# Patient Record
Sex: Female | Born: 1990 | Hispanic: Yes | Marital: Single | Smoking: Never smoker
Health system: Southern US, Community
[De-identification: ages and names within clinical notes are randomized; demographics above are authoritative.]

## PROBLEM LIST (undated history)

## (undated) ENCOUNTER — Inpatient Hospital Stay (HOSPITAL_COMMUNITY): Payer: Self-pay

## (undated) ENCOUNTER — Emergency Department (HOSPITAL_COMMUNITY): Discharge status: Absent without leave | Payer: Self-pay

## (undated) DIAGNOSIS — O26619 Liver and biliary tract disorders in pregnancy, unspecified trimester: Secondary | ICD-10-CM

## (undated) DIAGNOSIS — E282 Polycystic ovarian syndrome: Secondary | ICD-10-CM

## (undated) DIAGNOSIS — D649 Anemia, unspecified: Secondary | ICD-10-CM

## (undated) DIAGNOSIS — N946 Dysmenorrhea, unspecified: Secondary | ICD-10-CM

## (undated) DIAGNOSIS — K831 Obstruction of bile duct: Secondary | ICD-10-CM

## (undated) DIAGNOSIS — O26649 Intrahepatic cholestasis of pregnancy, unspecified trimester: Secondary | ICD-10-CM

## (undated) DIAGNOSIS — E669 Obesity, unspecified: Secondary | ICD-10-CM

## (undated) HISTORY — DX: Obstruction of bile duct: K83.1

## (undated) HISTORY — PX: NO PAST SURGERIES: SHX2092

## (undated) HISTORY — DX: Intrahepatic cholestasis of pregnancy, unspecified trimester: O26.649

## (undated) HISTORY — DX: Obesity, unspecified: E66.9

## (undated) HISTORY — DX: Dysmenorrhea, unspecified: N94.6

## (undated) HISTORY — DX: Polycystic ovarian syndrome: E28.2

---
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
## 2011-04-11 ENCOUNTER — Other Ambulatory Visit: Payer: Self-pay | Admitting: Geriatric Medicine

## 2011-04-11 DIAGNOSIS — N83209 Unspecified ovarian cyst, unspecified side: Secondary | ICD-10-CM

## 2011-04-13 ENCOUNTER — Other Ambulatory Visit: Payer: Self-pay

## 2011-04-13 ENCOUNTER — Ambulatory Visit
Admission: RE | Admit: 2011-04-13 | Discharge: 2011-04-13 | Disposition: A | Discharge status: Home / Self Care | Payer: No Typology Code available for payment source | Source: Ambulatory Visit | Attending: Geriatric Medicine | Admitting: Geriatric Medicine

## 2011-04-13 DIAGNOSIS — N83209 Unspecified ovarian cyst, unspecified side: Secondary | ICD-10-CM

## 2011-05-24 ENCOUNTER — Encounter: Payer: Self-pay | Admitting: Gynecology

## 2011-05-24 ENCOUNTER — Ambulatory Visit (INDEPENDENT_AMBULATORY_CARE_PROVIDER_SITE_OTHER): Payer: Self-pay | Admitting: Gynecology

## 2011-05-24 VITALS — BP 124/82 | Ht <= 58 in | Wt 162.0 lb

## 2011-05-24 DIAGNOSIS — E282 Polycystic ovarian syndrome: Secondary | ICD-10-CM | POA: Insufficient documentation

## 2011-05-24 DIAGNOSIS — N915 Oligomenorrhea, unspecified: Secondary | ICD-10-CM

## 2011-05-24 MED ORDER — LEVONORGESTREL-ETHINYL ESTRAD 0.1-20 MG-MCG PO TABS: 1.0000 | ORAL_TABLET | Freq: Every day | ORAL | Status: DC

## 2011-05-24 NOTE — Patient Instructions (Addendum)
Tomar 4 tabletas de medroxyprogesterone diaria por 5-10 dias para que le [employer].El terecer dia que estes sangrando vas a Corporate investment banker la pastilla anticonceptiva (alesse) una tableta  todos los dias.

## 2011-05-24 NOTE — Progress Notes (Signed)
Patient is a 20 year old gravida 0 that was referred for practice from the general medical clinic as a result the patient's long-standing history of oligomenorrhea. Patient states that she's had irregular cycles since her menarche started at the age of 52 she has a body mass index over 35 she weighs 174 pounds 4 feet 8-3/4 inches tall. She had seen her doctor at her primary office and she has been placed on metformin on Motrin HCTZ and also Provera at different times. Due to English barrier situation she is safe my assistance to help her with oligomenorrhea. She denies any visual disturbances exam usual headaches or any nipple discharge. She is using condoms for contraception and is not seeking to get pregnant and needed time. She denies any family history of any bleeding disorders or blood clots she is a nonsmoker has otherwise a benign past medical history. She had a Pap smear in March of this year at the general medical clinic which was normal and all her labs were all normal to include [facility] and LH which were normal in June 2012. She had an ultrasound in July of this year which was evidence of PCO otherwise unremarkable ultrasound. On physical exam: Abdomen: Soft nontender no rebound or guarding Bartholin urethra Skene glands: Within normal limits Vagina: No gross lesions on inspection Bimanual exam: Uterus anteverted normal size shape and consistency adnexa without any palpable masses or tenderness Rectal exam deferred It appears that patient is oligomenorrhea may be attributed to her being overweight to complete the evaluation will proceed with checking her TSH and prolactin. She has not had a menstrual period in 6 weeks. Although at times she has gone as much as a year without menses. We'll going to start her on Provera 10 mg for 10 days to withdrawal and then on the third day we'll start her on oral contraceptive pills such as a Alesse 20 year old contraceptive pill to help regulate her cycle. The risks  benefits and pros and cons were discussed is no contraindication or any family history of any bleeding disorders or pulmonary embolism. She herself is a nonsmoker. We also discussed importance of exercise are regular basis. I will need to see her back in 6 months for followup. All the above instructions were provided in written form in Spanish all questions were answered we'll follow accordingly.

## 2011-11-14 ENCOUNTER — Ambulatory Visit (INDEPENDENT_AMBULATORY_CARE_PROVIDER_SITE_OTHER): Payer: Self-pay

## 2011-11-14 ENCOUNTER — Encounter: Payer: Self-pay | Admitting: Gynecology

## 2011-11-14 ENCOUNTER — Ambulatory Visit (INDEPENDENT_AMBULATORY_CARE_PROVIDER_SITE_OTHER): Payer: Self-pay | Admitting: Gynecology

## 2011-11-14 VITALS — BP 112/70

## 2011-11-14 DIAGNOSIS — N8300 Follicular cyst of ovary, unspecified side: Secondary | ICD-10-CM

## 2011-11-14 DIAGNOSIS — R102 Pelvic and perineal pain: Secondary | ICD-10-CM

## 2011-11-14 DIAGNOSIS — N949 Unspecified condition associated with female genital organs and menstrual cycle: Secondary | ICD-10-CM

## 2011-11-14 DIAGNOSIS — Z113 Encounter for screening for infections with a predominantly sexual mode of transmission: Secondary | ICD-10-CM

## 2011-11-14 DIAGNOSIS — N898 Other specified noninflammatory disorders of vagina: Secondary | ICD-10-CM

## 2011-11-14 LAB — WET PREP FOR TRICH, YEAST, CLUE
Clue Cells Wet Prep HPF POC: NONE SEEN
Trich, Wet Prep: NONE SEEN

## 2011-11-14 NOTE — Patient Instructions (Signed)
Endometriosis (Endometriosis) La endometriosis es un trastorno que se produce cuando existen porciones de endometrio (el recubrimiento del tero) fuera de su ubicacin normal. Puede ocurrir en muchos lugares prximos al tero (matriz), pero generalmente se produce en los ovarios, en las trompas de [place]op[place], en la vagina (canal de parto) y en los intestinos que se encuentran cerca del tero. Debido a que el tero elimina (expulsa) su recubrimiento todos los meses (menstruacin), hay una Cabin crew en el que el tejido endometrial est ubicado. SNTOMAS Generalmente no se presentan sntomas (problemas); sin embargo, debido a que la sangre irrita los tejidos que normalmente no estn expuestos a ella, cuando se producen los sntomas, estos varan segn Immunologist al cual se haya desplazado el endometrio. Entre los sntomas se incluyen el dolor de espalda y el dolor abdominal (vientre). Los perodos pueden ser ms abundantes y las relaciones sexuales dolorosas. Puede haber infertilidad. Usted podr sufrir todos estos sntomas al [employer] o no, o puede haber meses en los que no tenga ningn sntoma. Aunque los sntomas se producen principalmente durante las menstruaciones, tambin pueden aparecer en la mitad del ciclo y generalmente terminan con la menopausia. DIAGNSTICO El profesional que la asiste podr indicarle un examen de sangre y de [place] para descartar otros trastornos. Otra prueba frecuente en estos casos es el [place], un procedimiento indoloro que utiliza ondas de sonido para hacer una ecografa del tejido anormal que indica endometriosis. Si siente dolor al mover el intestino durante el perodo, el profesional que la asiste podr indicarle un enema de bario (radiografa de la parte inferior del intestino), para tratar de Contractor origen del dolor. A veces se confirma por medio de una laparoscopa. La laparoscopa es un procedimiento en el que el profesional observa el interior del  abdomen con un laparoscopio (un pequeo telescopio con forma de lpiz). El profesional podr tomar una pequea muestra de tejido (biopsia) de los tejidos anormales para confirmar o Psychiatric nurse problema. Los tejidos se envan al laboratorio y un patlogo los observa bajo el microscopio para dar un diagnstico. TRATAMIENTO Una vez que se realiza el diagnstico, puede tratarse con la destruccin del tejido endometrial desplazado, utilizando calor (diatermia), lser, escisin (corte), o por medios qumicos. Tambin puede tratarse con terapia hormonal. Cuando se utiliza la terapia hormonal, se eliminan las [place], por lo tanto se elimina la exposicin mensual a la sangre del tejido endometrial desplazado. Slo en los casos graves es necesario realizar una histerectoma con extirpacin de las trompas, el tero y los ovarios. INSTRUCCIONES PARA EL CUIDADO DOMICILIARIO  Utilice los medicamentos de venta libre o de prescripcin para Chief Technology Officer, el malestar o la [place], segn se lo indique el profesional que lo asiste.   Evite las actividades que producen dolor, incluyendo las [employer].   No tome aspirinas ya que puede aumentar las hemorragias cuando no recibe terapia hormonal.   Consulte al profesional que la asiste si siente dolor o tiene problemas que no puede controlar con Scientist, research (medical).  SOLICITE ATENCIN MDICA DE INMEDIATO SI:  El dolor es intenso y no responde a Tourist information centre manager.   Comienza a sentir nuseas y vmitos o no puede [employer].   El dolor se localiza en la zona inferior derecha del abdomen (posible apendicitis).   Presenta hinchazn o aumento del dolor en el abdomen.   Tiene fiebre.   Observa sangre en la materia fecal.  EST SEGURO QUE:   Comprende las instrucciones para el alta mdica.  Controlar su enfermedad.   Solicitar atencin mdica de inmediato segn las indicaciones.  Document Released: 10/10/2005 Document Revised:  06/22/2011 [facility] Patient Information 2012 [place], [place].  Laparoscopa diagnstica (Diagnostic Laparoscopy) La laparoscopa es un procedimiento quirrgico relativamente simple, de uso habitual y breve (menos de una hora) que se lleva a cabo para diagnosticar y tratar enfermedades del abdomen. El laparoscopio (tubo delgado, que emite luz, del tamao de un lpiz y similar a un telescopio) se inserta en el abdomen a travs de una pequea incisin (corte realizado por un cirujano). A travs de este instrumento, el profesional podr observar [employer] rganos del interior del abdomen (vientre) y ver si hay algo anormal. La laparoscopa podr llevarse a cabo tanto en el hospital como en un consultorio. Podrn administrarle un sedante suave que lo ayudar a relajarse antes y durante el procedimiento. Una vez en la sala de operaciones, le administrarn una anestesia general (a menos que usted y el profesional elijan otro tipo de anestesia). Despus de la laparoscopa, que generalmente dura menos de Georgianne Fick, ser [employer] sala de recuperacin durante algunas horas. Cuando regrese a Pensions consultant, la Arts administrator [employer] y Streeter. RIESGOS Y COMPLICACIONES Comparados con los beneficios, los riesgos de la laparoscopia son relativamente pocos. El Animal nutritionist con usted los riesgos antes del procedimiento. Algunos problemas que pueden ocurrir luego de la intervencin son:  Infecciones.   Hemorragias.   Puede ocurrir que se lesionen otros rganos.   Efectos secundarios de Higher education careers adviser.  PROCEDIMIENTO Una vez que se encuentra anestesiado, el cirujano insufla el abdomen con un gas inofensivo (dixido de carbono) para Research officer, political party observacin de los rganos de la pelvis. El cirujano introduce el laparoscopio a travs de una pequea incisin en el ombligo o alrededor del mismo. Podr insertar otros instrumentos, como una sonda para mover los rganos o Education officer, environmental algn  procedimiento a travs de otra pequea incisin.  En ocasiones se toma una biopsia (muestra de tejido) para un diagnstico ms preciso o para Consulting civil engineer. La biopsia consiste en tomar una pequea muestra de tejido durante la laparoscopia para enviarlo al patlogo (especialista en la observacin de clulas y muestras de tejido) y que lo examine en el microscopio para un diagnstico a nivel de los tejidos. DESPUES DEL PROCEDIMIENTO  Se libera el gas del abdomen.   Las incisiones se cierran con puntos (suturas). Debido a que las incisiones son pequeas (generalmente de menos de 1 cm) las molestias son mnimas luego del procedimiento. Es posible que sienta cierto Sales executive. Es consecuencia de Education officer, community del tubo mientras se encontraba dormido. Es posible que sienta algn dolor abdominal no muy intenso. Tambin podr sentir Federal[employer] en las incisiones realizadas para insertar los instrumentos en el abdomen.   El tiempo de recuperacin es reducido, siempre que no haya habido complicaciones.   Har reposo en la sala de recuperacin hasta que se encuentre estable y se sienta bien. Si no aparecen complicaciones, podr regresar a su casa.  AVERIGE LOS RESULTADOS DE SU ANLISIS Durante su visita no contar con todos los [employer] Microsystems. En este caso, tenga otra entrevista con su mdico para conocerlos. No piense que el resultado es normal si no tiene noticias de su mdico o de la institucin mdica. Es Copy seguimiento de todos los [place]. INSTRUCCIONES PARA EL CUIDADO DOMICILIARIO  Tome todos los medicamentos tal como se le indic.   Utilice los [employer] de  venta libre o de prescripcin para Chief Technology Officer, el malestar o la fiebre, segn se lo indique el profesional que lo asiste.   Reanude las actividades habituales cuando se le indique.   Es preferible que se duche y no tome baos de inmersin.   Reanude la actividad sexual luego de  [place], o cuando lo autoricen.   No conduzca mientras se encuentre bajo los efectos de narcticos.  SOLICITE ATENCIN MDICA SI:  Siente un dolor abdominal inexplicable.   Siente dolor en los hombros, en la regin de los tirantes.   Si se siente aturdido o siente que se va a Artist.   Siente escalofros.   Usted o su nio tienen una temperatura oral de ms de 102 F (38.9 C).   Observa un drenaje purulento (similar al pus) que proviene de alguna de las heridas.   Usted o su hijo no puede realizar movimientos intestinales o evacuar gases.   Usted o su hijo sufren nuseas o vmitos.  EST SEGURO QUE:   Comprende las instrucciones para el alta mdica.   Controlar su enfermedad.   Solicitar atencin mdica de inmediato segn las indicaciones.  Document Released: 10/10/2005 Document Revised: 06/22/2011 [facility] Patient Information 2012 [place], [place].  Constipacin en los adultos (Constipation in Adults) Se denomina constipacin al hecho de mover el intestino menos de dos veces por semana. Generalmente las heces son duras. A medida que envejecemos, la constipacin es ms frecuente. Si trata de solucionarlo con laxantes, puede empeorar el problema. Los laxantes utilizados durante largos perodos pueden AMR Corporation msculos del colon. Esto har que la constipacin empeore gradualmente. Una dieta pobre en fibras, la ingesta insuficiente de lquidos y algunos medicamentos pueden empeorar el problema. ALGUNOS MEDICAMENTOS QUE PUEDEN CAUSAR CONSTIPACIN SON:  Diurticos.   Boqueadotes de los canales de calcio (medicamento utilizado para Chief Operating Officer la presin arterial y el funcionamiento cardaco.   Narcticos (ciertos medicamentos para Chief Technology Officer).   Anticolinrgicos.   Antiinflamatorios.   Anticidos que contengan aluminio.  ALGUNOS MEDICAMENTOS QUE PUEDEN CAUSAR CONSTIPACIN SON:   Diabetes.   Enfermedad de Parkinson.   Demencia (la mente no funciona adecuadamente  y existen trastornos del pensamiento).   Ictus.   Depresin.   Otras enfermedades que causan dificultades con el metabolismo de sales y lquidos.  INSTRUCCIONES PARA EL CUIDADO DOMICILIARIO  El mejor tratamiento para la constipacin es el que se realiza sin tomar medicamentos. Es importante consumir ms fibras, frutas y Sports administrator.   Aumente lentamente el consumo de fibras a 25 a 38 gramos por da. Granos integrales, frutas, verduras y legumbres son buenas fuentes de [place]. Un dietista podr ayudarlo a incorporar alimentos altos en fibra en su dieta.   Beba gran cantidad de lquido para mantener la orina de tono claro o color amarillo plido.   Deber aadir un suplemento de fibra en su dieta si no puede recibir la cantidad suficiente a partir de los alimentos.   Aumentar la actividad fsica tambin ayuda a mejorar la regularidad.   Los supositorios, segn lo haya indicado el mdico, podrn ser de utilidad. Si toma anticidos u otros productos que contengan aluminio o calcio, los que pueden causar constipacin, ser de gran ayuda cambiarlos por productos que contengan magnesio, con la aprobacin del mdico.   Si en el da de hoy le han aplicado un enema, considere que esta slo es una medida temporaria y no debe considerarse como tratamiento para la constipacin de Set designer data (crnica). Si utiliza enemas FedEx, se debilitarn  los msculos del colon y la Quarry manager.   Tambin deben evitarse en lo posible medidas ms enrgicas, como el consumo de sulfato de magnesio, siempre que sea posible. El sulfato de magnesio puede causar diarrea incontrolable. Sin embargo, si usted es una persona de edad [place], estas medidas pueden ser tentadoras. En algunos casos, este tipo de medidas ni siquiera le dan tiempo para llegar al bao.  SOLICITE ATENCIN MDICA DE INMEDIATO SI:  Observa sangre de color rojo brillante en las heces.   El estreimiento persiste durante ms de  [employer]   Presenta dolor abdominal o rectal junto con el estreimiento.   No parece sentirse mejor.   Tiene preguntas para formular, o alguna preocupacin, o no mejora.  EST SEGURO QUE:   Comprende las instrucciones para el alta mdica.   Controlar su enfermedad.   Solicitar atencin mdica de inmediato segn las indicaciones.  Document Released: 10/30/2007 Document Revised: 06/22/2011 [facility] Patient Information 2012 [place], [place].

## 2011-11-14 NOTE — Progress Notes (Signed)
Patient is a 21 year old gravida 0 with history of polycystic ovarian disease/oligomenorrhea was seen several months ago. Her cycles are now regular. She presented to the office states that for the past 2 weeks she was having some right lower quadrant discomfort on and off and some dyspareunia on and off. She was also concerned whether she has some "vulvar bumps" and slight discharge.  Exam: Abdomen: Soft nontender no rebound or guarding Pelvic: Bartholin urethra Skene was within normal limits Vagina: No gross lesions on inspection Cervix: No gross lesions on inspection Bimanual exam: Uterus anteverted no palpable masses or tenderness Adnexa tenderness on the right adnexa but no rebound guarding left adnexa unremarkable  Wet prep negative GC and Chlamydia culture result pending  Ultrasound uterus measures 7.4 x 5.0 x 3.3 cm endometrial stripe 2.7 mm right and left ovary otherwise normal and no masses were seen.  On further questioning patient sometimes has to strain suffers from what appears to be constipation. She will be placed on MiraLax 1 tablespoon daily and she is to return back in 3 months for her complete annual exam and we'll reassess. I did give her information on endometriosis as well as a laparoscopy. If the symptoms continue a more regular basis affecting her quality of life we can justify proceeding with a diagnostic laparoscopy. All the above was discussed in Spanish and literature formation was provided as well.

## 2012-03-12 ENCOUNTER — Encounter (HOSPITAL_COMMUNITY): Payer: Self-pay | Admitting: Emergency Medicine

## 2012-03-12 ENCOUNTER — Emergency Department (HOSPITAL_COMMUNITY)
Admission: EM | Admit: 2012-03-12 | Discharge: 2012-03-12 | Disposition: A | Discharge status: Home / Self Care | Payer: Self-pay | Attending: Emergency Medicine | Admitting: Emergency Medicine

## 2012-03-12 ENCOUNTER — Emergency Department (HOSPITAL_COMMUNITY): Payer: Self-pay

## 2012-03-12 DIAGNOSIS — R1011 Right upper quadrant pain: Secondary | ICD-10-CM | POA: Insufficient documentation

## 2012-03-12 DIAGNOSIS — N760 Acute vaginitis: Secondary | ICD-10-CM | POA: Insufficient documentation

## 2012-03-12 DIAGNOSIS — A499 Bacterial infection, unspecified: Secondary | ICD-10-CM | POA: Insufficient documentation

## 2012-03-12 DIAGNOSIS — B9689 Other specified bacterial agents as the cause of diseases classified elsewhere: Secondary | ICD-10-CM | POA: Insufficient documentation

## 2012-03-12 DIAGNOSIS — R109 Unspecified abdominal pain: Secondary | ICD-10-CM

## 2012-03-12 LAB — POCT I-STAT, CHEM 8
Chloride: 108 mEq/L (ref 96–112)
Creatinine, Ser: 0.7 mg/dL (ref 0.50–1.10)
Glucose, Bld: 99 mg/dL (ref 70–99)
Potassium: 3.6 mEq/L (ref 3.5–5.1)

## 2012-03-12 LAB — CBC
HCT: 40.1 % (ref 36.0–46.0)
Hemoglobin: 14 g/dL (ref 12.0–15.0)
MCHC: 34.9 g/dL (ref 30.0–36.0)
MCV: 85.9 fL (ref 78.0–100.0)
RDW: 13.7 % (ref 11.5–15.5)

## 2012-03-12 LAB — COMPREHENSIVE METABOLIC PANEL
ALT: 23 U/L (ref 0–35)
Alkaline Phosphatase: 66 U/L (ref 39–117)
BUN: 14 mg/dL (ref 6–23)
CO2: 23 mEq/L (ref 19–32)
Calcium: 9.5 mg/dL (ref 8.4–10.5)
Creatinine, Ser: 0.59 mg/dL (ref 0.50–1.10)
GFR calc Af Amer: >90 mL/min (ref >90)

## 2012-03-12 LAB — WET PREP, GENITAL
Trich, Wet Prep: NONE SEEN
WBC, Wet Prep HPF POC: NONE SEEN
Yeast Wet Prep HPF POC: NONE SEEN

## 2012-03-12 LAB — URINALYSIS, ROUTINE W REFLEX MICROSCOPIC
Bilirubin Urine: NEGATIVE
Hgb urine dipstick: NEGATIVE
Specific Gravity, Urine: 1.036 — ABNORMAL HIGH (ref 1.005–1.030)
Urobilinogen, UA: 0.2 mg/dL (ref 0.0–1.0)
pH: 6 (ref 5.0–8.0)

## 2012-03-12 LAB — DIFFERENTIAL
Basophils Absolute: 0 10*3/uL (ref 0.0–0.1)
Basophils Relative: 0 % (ref 0–1)
Eosinophils Relative: 3 % (ref 0–5)
Monocytes Absolute: 0.8 10*3/uL (ref 0.1–1.0)
Monocytes Relative: 6 % (ref 3–12)

## 2012-03-12 MED ORDER — ONDANSETRON 4 MG PO TBDP: 8.0000 mg | ORAL_TABLET | Freq: Once | ORAL | Status: DC

## 2012-03-12 MED ORDER — HYDROMORPHONE HCL PF 1 MG/ML IJ SOLN
1.0000 mg | Freq: Once | INTRAMUSCULAR | Status: AC
Start: 2012-03-12 — End: 2012-03-12
  Administered 2012-03-12: 1 mg via INTRAVENOUS
  Filled 2012-03-12: qty 1

## 2012-03-12 MED ORDER — IOHEXOL 300 MG/ML  SOLN
20.0000 mL | INTRAMUSCULAR | Status: AC
  Administered 2012-03-12: 20 mL via ORAL

## 2012-03-12 MED ORDER — ONDANSETRON HCL 4 MG/2ML IJ SOLN: 4.0000 mg | Freq: Once | INTRAMUSCULAR | Status: DC

## 2012-03-12 MED ORDER — NAPROXEN 500 MG PO TABS: 500.0000 mg | ORAL_TABLET | Freq: Two times a day (BID) | ORAL | Status: AC

## 2012-03-12 MED ORDER — OXYCODONE-ACETAMINOPHEN 5-325 MG PO TABS: 1.0000 | ORAL_TABLET | ORAL | Status: AC | PRN

## 2012-03-12 MED ORDER — METRONIDAZOLE 500 MG PO TABS: 500.0000 mg | ORAL_TABLET | Freq: Two times a day (BID) | ORAL | Status: AC

## 2012-03-12 MED ORDER — ONDANSETRON 4 MG PO TBDP
ORAL_TABLET | ORAL | Status: AC
  Filled 2012-03-12: qty 2

## 2012-03-12 MED ORDER — IOHEXOL 300 MG/ML  SOLN
100.0000 mL | Freq: Once | INTRAMUSCULAR | Status: AC | PRN
  Administered 2012-03-12: 100 mL via INTRAVENOUS

## 2012-03-12 NOTE — Discharge Instructions (Signed)
You have been diagnosed with undifferentiated abdominal pain.  Abdominal pain can be caused by many things. Your caregiver evaluates the seriousness of your pain by an examination and possibly blood or urine tests and imaging (CT scan, x-rays, ultrasound). Many cases can be observed and treated at home after initial evaluation in the emergency department. Even though you are being discharged home, abdominal pain can be unpredictable. Therefore, you need a repeat exam if your pain does not resolve, returns, or worsens. Most patient's with abdominal pain do not need to be admitted to the hospital or have surgery, but serious problems like appendicitis and gallbladder attacks can start out as nonspecific pain. Many abdominal conditions cannot be diagnosed in 1 visit, so followup evaluations are very important.  Seek immediate medical attention if:  *The pain does not go away or becomes severe. *Temperature above 101 develops *Repeated vomiting occurs(multiple episodes) *The pain becomes localized to portions of the abdomen. The right side could possibly be appendicitis. In an adult, the left lower portion of the abdomen could be colitis or diverticulitis. *Blood is being passed in stools or vomit *Return also if you develop chest pain, difficulty breathing, dizziness or fainting, or become confused poorly responsive or inconsolable (young children).     If you do not have a physician, you should reference the below phone numbers and call in the morning to establish follow up care.  RESOURCE GUIDE  Dental Problems  Patients with Medicaid: [facility]                     [facility] [address]                                           [address] Phone:  [phone]                                                  Phone:  [phone]  If unable to pay or uninsured, contact:  [facility] or [facility] to become qualified for the adult dental  clinic.  Chronic Pain Problems Contact [facility]  [phone] Patients need to be referred by their primary care doctor.  Insufficient Money for Medicine Contact United Way:  call "211" or [facility] 271-5999.  No Primary Care Doctor Call [facility]  [phone] Other agencies that provide inexpensive medical care    [facility]  [phone]    [facility]  [phone]    [facility]  [phone]    Women's Clinic  [phone]    [facility]  [phone]    [facility]  [phone]  Psychological Services [facility]  [phone] [facility]  [phone] [facility]   [phone] (emergency services [phone])  Substance Abuse Resources [facility]  336-882-2125 [facility] 336-784-9470 [facility] 336-285-9073 [facility] 336-845-3988 Residential & Outpatient Substance Abuse Program  800-659-3381  Abuse/Neglect [place] Child Abuse Hotline (336) 641-3795 [place] [facility] Abuse Hotline 800-378-5315 (After Hours)  Emergency Shelter [facility] (336) 271-5985  [facility] (336) [phone] [facility]   Services (704) 372-4663  MRSA Hotline #:   832-7006    [place] Resources  [facility] [place]     [employer]                          [facility] [address] [place]                       [address]      [address]                                                  [place]                            [place] Phone:  [phone]                                   Phone:  [phone]                 Phone:  [phone]  [facility] Phone:  [phone]  [place] Child Abuse Hotline (336) 342-1394 (336) 342-3537 (After Hours)    

## 2012-03-12 NOTE — ED Notes (Signed)
Pt very limited english speaking but reports thru sig other thatn she is having abd pain radiating to RUQ

## 2012-03-12 NOTE — ED Provider Notes (Addendum)
History     CSN: [id number]  Arrival date & time 03/12/12  0151   First MD Initiated Contact with Patient 03/12/12 0231      Chief Complaint  Patient presents with  . Abdominal Pain    lower abd and RUQ    (Consider location/radiation/quality/duration/timing/severity/associated sxs/prior treatment) HPI Comments: 21 year old female with no significant past medical history who presents with right-sided abdominal pain which is in the suprapubic area radiating to the right upper quadrant. This started in the last 2 days, has become persistent and severe and rates her pain at 10 out of 10 and sharp and stabbing. She denies dysuria but admits to having subjective fevers at home as well as nausea but no vomiting. No changes in bowel habits, no history of abdominal surgery, the pain is not made worse with eating, drinking or position.  Patient is a 21 y.o. female presenting with abdominal pain. The history is provided by the patient and a relative. The history is limited by a language barrier. A language interpreter was used.  Abdominal Pain The primary symptoms of the illness include abdominal pain.    History reviewed. No pertinent past medical history.  History reviewed. No pertinent past surgical history.  History reviewed. No pertinent family history.  History  Substance Use Topics  . Smoking status: Never Smoker   . Smokeless tobacco: Not on file  . Alcohol Use: No    OB History    Grav Para Term Preterm Abortions TAB SAB Ect Mult Living                  Review of Systems  Gastrointestinal: Positive for abdominal pain.  All other systems reviewed and are negative.    Allergies  Review of patient's allergies indicates no known allergies.  Home Medications   Current Outpatient Rx  Name Route Sig Dispense Refill  . LEVONORGESTREL-ETHINYL ESTRAD 0.1-20 MG-MCG PO TABS Oral Take 1 tablet by mouth daily.    Marland Kitchen NAPROXEN SODIUM 220 MG PO TABS Oral Take 220 mg by mouth 2  (two) times daily as needed. For pain    . METRONIDAZOLE 500 MG PO TABS Oral Take 1 tablet (500 mg total) by mouth 2 (two) times daily. 14 tablet 0  . NAPROXEN 500 MG PO TABS Oral Take 1 tablet (500 mg total) by mouth 2 (two) times daily with a meal. 30 tablet 0  . OXYCODONE-ACETAMINOPHEN 5-325 MG PO TABS Oral Take 1 tablet by mouth every 4 (four) hours as needed for pain. May take 2 tablets PO q 6 hours for severe pain - Do not take with Tylenol as this tablet already contains tylenol 15 tablet 0    BP 138/83  Pulse 77  Temp(Src) 98.3 F (36.8 C) (Oral)  Resp 18  SpO2 97%  LMP 02/21/2012  Physical Exam  Nursing note and vitals reviewed. Constitutional: She appears well-developed and well-nourished.       Uncomfortable appearing  HENT:  Head: Normocephalic and atraumatic.  Mouth/Throat: Oropharynx is clear and moist. No oropharyngeal exudate.  Eyes: Conjunctivae and EOM are normal. Pupils are equal, round, and reactive to light. Right eye exhibits no discharge. Left eye exhibits no discharge. No scleral icterus.  Neck: Normal range of motion. Neck supple. No JVD present. No thyromegaly present.  Cardiovascular: Normal rate, regular rhythm, normal heart sounds and intact distal pulses.  Exam reveals no gallop and no friction rub.   No murmur heard. Pulmonary/Chest: Effort normal and breath sounds normal. No  respiratory distress. She has no wheezes. She has no rales.  Abdominal: Soft. Bowel sounds are normal. She exhibits no distension and no mass. There is tenderness ( Focal suprapubic and mild right lower quadrant tenderness, no upper abdominal including epigastric, right upper cautery or left upper quadrant tenderness.).       Abdomen is soft, bowel sounds are normal, non-peritoneal.  CVA tenderness is present on the right  Genitourinary:       Pelvic exam with chaperone present, normal external genitalia, normal appearing cervix with a closed cervical os, no cervical motion tenderness,  no vaginal discharge or bleeding, no adnexal tenderness masses or fullness.  Musculoskeletal: Normal range of motion. She exhibits no edema and no tenderness.  Lymphadenopathy:    She has no cervical adenopathy.  Neurological: She is alert. Coordination normal.  Skin: Skin is warm and dry. No rash noted. No erythema.  Psychiatric: She has a normal mood and affect. Her behavior is normal.    ED Course  Procedures (including critical care time)  Labs Reviewed  CBC - Abnormal; Notable for the following:    WBC 12.2 (*)    All other components within normal limits  DIFFERENTIAL - Abnormal; Notable for the following:    Neutro Abs 8.1 (*)    All other components within normal limits  COMPREHENSIVE METABOLIC PANEL - Abnormal; Notable for the following:    Total Bilirubin 0.1 (*)    All other components within normal limits  URINALYSIS, ROUTINE W REFLEX MICROSCOPIC - Abnormal; Notable for the following:    Specific Gravity, Urine 1.036 (*)    Ketones, ur 15 (*)    All other components within normal limits  WET PREP, GENITAL - Abnormal; Notable for the following:    Clue Cells Wet Prep HPF POC MANY (*)    All other components within normal limits  PREGNANCY, URINE  LIPASE, BLOOD  POCT I-STAT, CHEM 8  GC/CHLAMYDIA PROBE AMP, GENITAL   Ct Abdomen Pelvis W Contrast  03/12/2012  *RADIOLOGY REPORT*  Clinical Data: Right-sided abdominal pain  CT ABDOMEN AND PELVIS WITH CONTRAST  Technique:  Multidetector CT imaging of the abdomen and pelvis was performed following the standard protocol during bolus administration of intravenous contrast.  Contrast: 1 OMNIPAQUE IOHEXOL 300 MG/ML  SOLN, OMNIPAQUE IOHEXOL 300 MG/ML  SOLN  Comparison: None.  Findings: Mild dependent opacities, likely atelectasis.  Normal heart size.  No pleural or pericardial effusion.  Unremarkable liver, biliary system, spleen, pancreas, adrenal glands, kidneys.  No hydronephrosis or hydroureter.  No urinary tract calculi  identified.  Distended stomach, nonspecific however can be seen with gastroparesis.  Contrast is present within normal caliber small bowel, therefore no obstruction.  No CT evidence for colitis. Normal appendix.  No free intraperitoneal air or fluid.  No lymphadenopathy.  Normal caliber vasculature.  Thin-walled bladder.  Unremarkable CT appearance to the uterus and adnexa.  Tiny fat containing inguinal hernias.  No acute osseous abnormality.  IMPRESSION: Gastric distension is nonspecific.  Can be seen with gastroparesis. Otherwise, no acute CT abnormality.  Original Report Authenticated By: Waneta Martins, M.D.     1. Abdominal pain   2. Bacterial vaginosis       MDM  The patient has normal vital signs but tenderness in the suprapubic region with radiation to the right flank. At this time she does have tenderness on exam which would be consistent with an infectious process such as a urinary infection, cystitis or pyelonephritis. Urinalysis pending, labs  pending, rule out other source such as cholecystitis or appendicitis if urinalysis is negative. Pain medication ordered  Results of data reviewed showing a normal cooperative metabolic panel, normal lipase, normal urinalysis other than some dehydration, negative pregnancy, normal CBC other than a slight leukocytosis of 12,200. Because of her pain on the right side and a mild leukocytosis ordered a CT scan. The results of the CT scan show no signs of appendicitis - in fact he commented that there was a normal appearing appendix. There was no intraperitoneal air or fluid, no lymphadenopathy and essentially no other abnormalities other than slight gastric distention. The patient has been given pain medication and repeat evaluation shows that there is improved exam, minimal tenderness, soft abdomen. Because she's has persistent right-sided tenderness despite improvement I have encouraged the patient to followup within 12 hours for a repeat exam if her  pain persists.  I have used the interpreter phone to explain the patient her results and indications for followup and she agrees to the plan.  Discharge Prescriptions include:  Percocet Naprosyn Flagyl  Vida Roller, MD 03/12/12 [id number]  Vida Roller, MD 03/12/12 [phone]

## 2012-03-29 ENCOUNTER — Ambulatory Visit (INDEPENDENT_AMBULATORY_CARE_PROVIDER_SITE_OTHER): Payer: Self-pay | Admitting: Gynecology

## 2012-03-29 ENCOUNTER — Encounter: Payer: Self-pay | Admitting: Gynecology

## 2012-03-29 ENCOUNTER — Other Ambulatory Visit (HOSPITAL_COMMUNITY)
Admission: RE | Admit: 2012-03-29 | Discharge: 2012-03-29 | Disposition: A | Discharge status: Home / Self Care | Payer: Self-pay | Source: Ambulatory Visit | Attending: Gynecology | Admitting: Gynecology

## 2012-03-29 VITALS — BP 110/70 | Ht <= 58 in | Wt 148.0 lb

## 2012-03-29 DIAGNOSIS — Z01419 Encounter for gynecological examination (general) (routine) without abnormal findings: Secondary | ICD-10-CM | POA: Insufficient documentation

## 2012-03-29 DIAGNOSIS — E663 Overweight: Secondary | ICD-10-CM

## 2012-03-29 DIAGNOSIS — N915 Oligomenorrhea, unspecified: Secondary | ICD-10-CM

## 2012-03-29 NOTE — Progress Notes (Signed)
Amber Warren 1991/10/22 [id number]   History:    21 y.o.  for annual gyn exam who hasn't been in the emergency room approximately 3 weeks ago for vague low abdominal pains which she states she's had for 6 months on and off right lower abdomen and recently radiated to her back. Patient had normal labs a normal CT of the abdomen and pelvis and was treated for BV with Flagyl. Patient with past history of oligomenorrhea with normal TSH and FSH last year currently on oral contraceptive pill with normal regular cycles. Patient stated now that she would like to try to get pregnant in the future. She does suffer from constipation at times. Her abdominal discomfort occurs once or twice a month not associated with meals and she denies dyspareunia. GC and Chlamydia culture done in the emergency room recently negative. No dyspareunia reported.  Past medical history,surgical history, family history and social history were all reviewed and documented in the EPIC chart.  Gynecologic History Patient's last menstrual period was 03/24/2012. Contraception: OCP (estrogen/progesterone) Last Pap: ?Marland Kitchen Results were: ? Last mammogram: Not indicated. Results were: Not indicated  Obstetric History OB History    Grav Para Term Preterm Abortions TAB SAB Ect Mult Living   0                ROS: A ROS was performed and pertinent positives and negatives are included in the history.  GENERAL: No fevers or chills. HEENT: No change in vision, no earache, sore throat or sinus congestion. NECK: No pain or stiffness. CARDIOVASCULAR: No chest pain or pressure. No palpitations. PULMONARY: No shortness of breath, cough or wheeze. GASTROINTESTINAL: Vague once or twice a month right abdominal discomfort, no nausea, vomiting or diarrhea, melena or bright red blood per rectum. GENITOURINARY: No urinary frequency, urgency, hesitancy or dysuria. MUSCULOSKELETAL: No joint or muscle pain, no back pain, no recent trauma. DERMATOLOGIC: No rash, no  itching, no lesions. ENDOCRINE: No polyuria, polydipsia, no heat or cold intolerance. No recent change in weight. HEMATOLOGICAL: No anemia or easy bruising or bleeding. NEUROLOGIC: No headache, seizures, numbness, tingling or weakness. PSYCHIATRIC: No depression, no loss of interest in normal activity or change in sleep pattern.     Exam: chaperone present  BP 110/70  Ht 4' 8.75" (1.441 m)  Wt 148 lb (67.132 kg)  BMI 32.31 kg/m2  LMP 03/24/2012  Body mass index is 32.31 kg/(m^2).  General appearance : Well developed well nourished female. No acute distress HEENT: Neck supple, trachea midline, no carotid bruits, no thyroidmegaly Lungs: Clear to auscultation, no rhonchi or wheezes, or rib retractions  Heart: Regular rate and rhythm, no murmurs or gallops Breast:Examined in sitting and supine position were symmetrical in appearance, no palpable masses or tenderness,  no skin retraction, no nipple inversion, no nipple discharge, no skin discoloration, no axillary or supraclavicular lymphadenopathy Abdomen: no palpable masses or tenderness, no rebound or guarding Extremities: no edema or skin discoloration or tenderness  Pelvic:  Bartholin, Urethra, Skene Glands: Within normal limits             Vagina: No gross lesions or discharge  Cervix: No gross lesions or discharge  Uterus  anteverted, normal size, shape and consistency, non-tender and mobile  Adnexa  Without masses or tenderness  Anus and perineum  normal   Rectovaginal  normal sphincter tone without palpated masses or tenderness             Hemoccult not done     Assessment/Plan:  21 y.o. female for annual exam with symptoms highly suspicious for IBS. Patient will be instructed to take MiraLax 1 tablespoon daily. She will finish her current oral contraceptive pill pack and monitor her cycles for the following 2 months and then make an appointment to see me in consultation. If she continues to suffer from oligomenorrhea we will  have to start ovulation induction medication. Meanwhile she was instructed to start taking prenatal vitamins. Pap smear was done today no additional blood work was drawn.    Ok Edwards MD, 10:30 AM 03/29/2012

## 2012-03-29 NOTE — Patient Instructions (Addendum)
Infertilidad (Infertility) QU ES LA INFERTILIDAD?  La infertilidad normalmente se define como la incapacidad para quedar embarazada luego de un ao de relaciones sexuales regulares sin la utilizacin de mtodos anticonceptivos. O la incapacidad de llevar a trmino Chartered loss adjuster y Warehouse manager el beb. La tasa de infertilidad en los [place] es de alrededor del 10%. El [address] es el resultado de una cadena de sucesos. La mujer debe liberar el vulo de uno de sus ovarios (ovulacin). El vulo debe fertilizarse con el esperma. Luego viaja a travs de las trompas de [place] hacia el tero (matriz), donde se une a la pared del tero y crece. El hombre debe tener suficiente esperma y el esperma debe unirse con el vulo (fertilizar) en el momento justo. El vulo fertilizado debe luego unirse al interior del tero. Esto parece simple, pero pueden ocurrir [employer] cosas que evitan que el embarazo se produzca.  DE QUIN ES EL PROBLEMA?  Un 20% de los casos de infertilidad se deben a problemas del hombres (factores masculinos) y un 65% se debe a problemas de la mujer (factores femeninos). Otras causas pueden ser [place]om[place] Dear combinacin de factores masculinos y femeninos o a causas desconocidas.  CULES SON LAS CAUSAS DE LA INFERTILIDAD EN EL HOMBRE?  La infertilidad en el hombre a menudo se debe a problemas para producir el esperma o hacer que el mismo llegue al vulo. Los problemas de esperma pueden existir desde el nacimiento o desarrollarse ms tarde debido a enfermedades o lesiones. Algunos hombres no producen esperma, o producen muy poco (oligospermia). Entre otros problemas se incluyen:  Disfuncin sexual   Problemas hormonales o endocrinos.   La edad. La fertilidad del hombre disminuye con la edad, pero no tan temprano como la femenina.   Infecciones.   Problemas congnitos Defectos de nacimiento, como ausencia de los tubos que transportan el esperma (conductos deferentes).   Problemas genticos o de  cromosomas.   Problemas de anticuerpos antiesperma.   Eyaculacin retrgrada (el esperma va hacia la vejiga).   Varicoceles, espermatoceles o tumores en los testculos.   El estilo de vida puede influir en el nmero y la calidad del esperma del hombre.   El alcohol y las drogas pueden reducir temporalmente la calidad del esperma.   Las toxinas [address], como los pesticidas y el plomo, pueden ocasionar algunos casos de infertilidad en hombres.  CULES SON LAS CAUSAS DE LA INFERTILIDAD EN LA MUJER?   La infertilidad en las mujeres est causada mayormente por problemas con la ovulacin. Sin ovulacin, el vulo no puede fertilizarse.   Seales de problemas de ovulacin son perodos menstruales irregulares o ningn perodo.   Factores simples del estilo de vida, como el estrs, la dieta, o el entrenamiento deportivo, pueden afectar el balance hormonal de Medical laboratory scientific officer.   La edad. La fertilidad comienza a IT consultant alrededor de los 30 aos y [place] a Glass blower/designer de los 37.   Mucho menos a menudo, un desequilibrio hormonal por un problema mdico serio como un tumor en la glndula pituitaria, tiroides u otra enfermedad mdica crnica pueden ocasionar problemas de ovulacin.   Infecciones plvicas.   Sndrome de ovarios poliqusticos (aumento de hormonas masculinas, incapacidad de ovular).   Consumo de alcohol o drogas.   Toxinas ambientales, radiacin, pesticidas y ciertos qumicos.   La edad es un factor importante en la infertilidad femenina.   La capacidad de los ovarios de una mujer para producir vulos disminuye con la edad, especialmente despus de los 35 aos.  Alrededor de un tercio de las parejas en las que la mujer tiene ms de 35 aos tendr problemas de infertilidad.   En el momento en el que alcanza la [place], cuando su perodo menstrual se detiene, la mujer ya no puede producir vulos ni quedar embarazada.   Otros problemas tambin pueden llevar a la  infertilidad en las mujeres. Si las trompas de [employer] estn bloqueadas en [employer] extremos, el vulo no puede pasar a travs de los tubos [place]. Tejido cicatrizal (adhesiones) en la pelvis que pueden obstruir las trompas. Esto puede dar como resultado una enfermedad inflamatoria plvica, endometriosis o una ciruga por embarazo ectpico (en la que el vulo fertilizado se ha implantado fuera del tero) o cualquier ciruga plvica o abdominal que ocasione adherencias.   Tumores fibroides o plipos en el tero.   Anormalidades congnitas (de nacimiento) del tero.   Infecciones en el cuello del tero (cervicitis).   Estenosis cervical (estrechamiento).   Mucosidad cervical anormal.   Sndrome poliqustico de los ovarios.   Tener relaciones sexuales muy seguidas (todos [place] o 4 a 5 veces por semana).   Obesidad.   Anorexia.   Dficit nutricional.   Mucho ejercicio, con prdida de grasa corporal.   DES. Su madre ha recibido la hormona dietilstilbesterol cuando estaba embarazada de usted.  CMO SE ANALIZA LA INFERTILIDAD?  Si ha estado tratando de quedar embarazada sin xito, podra querer buscar ayuda mdica. No debera esperar un ao de intentos sin xito antes de buscar ayuda profesional si:  Tiene mas de 35 aos de edad   Tiene razones para creer que podra tener problemas de fertilidad.  Un examen mdico determinar las causas de infertilidad de la pareja, Normalmente el proceso comienza con:  Exmenes fsicos   Historias clnicas de ambos miembros de la pareja.   Historiales sexuales de ambos miembros de la pareja.  Si no hay un problema evidente, como relaciones sexuales en tiempos inadecuados o ausencia de ovulacin, se necesitarn Academic librarian.   Para el hombre, los anlisis normalmente comienzan con pruebas de semen para observar:   La cantidad de esperma.   La forma del esperma.   El movimiento del esperma.   Realizar un historial clnico  y quirrgico completo.   Examen fsico.   Control de infecciones en los rganos reproductivos.  Podrn realizarle anlisis hormonales.   Para la mujer, el primer paso del anlisis es saber si ovula cada mes. Hay diferentes modos de Designer, industrial/product. Por ejemplo, puede llevar un registro de los cambios en la temperatura corporal por la maana y la textura de la mucosidad cervical. Otra herramienta es un kit de prueba de ovulacin casero, que puede comprarse en la farmacia.   Tambin pueden realizarse controles de ovulacin en el consultorio mdico, mediante anlisis de sangre para observar los niveles de hormonas o pruebas de [employer] ovarios. Si la mujer est ovulando, se necesitarn realizar ms exmenes. Algunas femeninas comunes incluyen:   Histerosalpingografa: Se realizan rayos x de las trompas de [place]op[place] y el tero con una inyeccin de [place]. Mostrar si las trompas estn abiertas y la forma del tero.   Laparoscopa: Un anlisis de las trompas y otros rganos femeninos para Copywriter, advertising. Se utiliza un tubo con iluminacin llamado laparoscopio para observar el interior del abdomen.   Biopsia endometrial: Se toma una muestra del tejido del tero Film/video editor del perodo menstrual, para ver si el tejido le indica si est ovulando.  Prueba de ultrasonido transvaginal: Examina los rganos femeninos.   Histeroscopa: Utiliza un tubo con iluminacin para examinar el cuello del tero y el tero y ver si hay anormalidades dentro del mismo.  TRATAMIENTO Dependiendo de los [employer], se sugerirn IT sales professional. El tratamiento depende de la causa. Entre el 85 y el 90% de los casos de infertilidad se tratan con drogas o [place].   Hay varias drogas para la fertilidad que pueden utilizarse para las mujeres con problemas de ovulacin. Es importante hablar con el profesional que la asiste sobre la droga a Chemical engineer. Deber comprender los beneficios y  efectos colaterales de las drogas. Segn el tipo de droga para la fertilidad y la dosis [place], algunas mujeres podran tener embarazos mltiples (mellizos).   De ser [employer], se puede realizar una ciruga para reparar daos en los ovarios de la [place], trompas de [place], el cuello del tero o el tero.   Tratamiento quirrgico o mdico para la endometriosis o el sndrome de ovario poliqustico. A veces, los problemas de fertilidad en el hombre pueden corregirse con medicamentos o [place].   Inseminacin intrauterina, IUI, de esperma en concordancia con la ovulacin.   Cambios en el estilo de vida, si esta es la causa (prdida de [place], aumento de ejercicios, dejar de fumar, beber excesivamente o tomar drogas ilegales).   Otros tipos de ciruga:   Extirpar tumores por dentro o sobre el tero.   Eliminar tejido cicatrizal de dentro del tero.   Arreglar trompas obstruidas.   Eliminar tejido cicatrizal en la pelvis y alrededor de los rganos femeninos.  QU ES LA TECNOLOGA DE REPRODUCCIN ASISTIDA (ART)?  La tecnologa de reproduccin asistida (ART) utiliza mtodos especiales para ayudar a parejas infrtiles. En esta tcnica se manipula tanto el vulo de la mujer como el esperma del hombre. El xito depende de muchos factores. La ART puede ser cara y [address]. Pero ha hecho posible que muchas parejas tengan hijos que de otra manera no hubieran podido. A continuacin se enumeran algunos mtodos:  Fertilizacin. In Vitro (FIV). In Vitro (IVF) es un procedimiento que se hizo famoso con el nacimiento en 1978 de [place], el primer "beb de probeta" del mundo. Se utiliza cuando las trompas de [employer] de la mujer estn bloqueadas o cuando el hombre tiene poco esperma. Se utiliza una droga para estimular los ovarios y producir mltiples vulos. Una vez maduros, los vulos se retiran y se Industrial/product designer en una placa de cultivo con el esperma del hombre para la fertilizacin. Luego de  aproximadamente 40 horas, los vulos se examinan para ver si han sido fertilizados por el esperma y se han dividido en clulas. Esos vulos fertilizados (embriones) se colocan luego en el tero de la mujer. Esto evita el paso por las trompas de [place].   La transferencia intrafalopiana de gametas (GIFT) es similar al IVF, pero se utiliza cuando la mujer tiene al menos una trompa de falopio normal. Se colocan de tres a cinco vulos en la trompa de [place], junto con el esperma del hombre, para que la fertilizacin se realice dentro del cuerpo de Architectural technologist.   La transferencia intrafalopiana de cigotos (ZIFT), tambin se denomina transferencia embrionaria, y [place] el IVF con el GIFT. Los vulos retirados de los ovarios de la mujer se Land laboratorio y se Industrial/product designer en las trompas de [employer] en lugar de en el tero.   Los procedimientos de ART a menudo suponen la utilizacin de donantes de  vulos (de otra mujer) o de embriones congelados previamente. Los donantes de vulos pueden utilizarse si la mujer tiene [employer] ovarios o posee una enfermedad gentica que podra transmitirla al beb.   Cuando se realiza una ART hay mayor riesgo de embarazos mltiples, mellizos, trillizos o ms.   La inyeccin de esperma intracitoplasmtica es un procedimiento que inyecta un espermatozoide en el vulo para fertilizarlo.   El trasplante embrionario es un procedimiento que comienza con un embrin que se ha desarrollado en un medio especial (solucin qumica) preparado para mantener el embrin vivo por 2 a 5 das, y Conservation officer, historic buildings.  En los [employer] que no puede encontrarse la causa y Firefighter no se produce, podr considerarse la adopcin. Document Released: 10/30/2007 Document Revised: 09/29/2011 [facility] Patient Information 2012 [place], [place].   Termina la pastilla anticonceptiva este mes. Compra pastilla prenatal que puedes comprar en [employer] y tomar una diaria El  Miralax  que le di lo puede comprar en la farmacia y tomar una cucharadita con jugo todas las mananas [place] verla para Corporate investment banker tratamiento de infertilida dos meses despues que termine las pastilla anticonceptiva

## 2012-09-14 ENCOUNTER — Ambulatory Visit (INDEPENDENT_AMBULATORY_CARE_PROVIDER_SITE_OTHER): Payer: Self-pay | Admitting: Gynecology

## 2012-09-14 ENCOUNTER — Encounter: Payer: Self-pay | Admitting: Gynecology

## 2012-09-14 VITALS — BP 112/70

## 2012-09-14 DIAGNOSIS — N915 Oligomenorrhea, unspecified: Secondary | ICD-10-CM

## 2012-09-14 DIAGNOSIS — R102 Pelvic and perineal pain: Secondary | ICD-10-CM

## 2012-09-14 DIAGNOSIS — R635 Abnormal weight gain: Secondary | ICD-10-CM

## 2012-09-14 DIAGNOSIS — N949 Unspecified condition associated with female genital organs and menstrual cycle: Secondary | ICD-10-CM

## 2012-09-14 MED ORDER — CLOMIPHENE CITRATE 50 MG PO TABS: ORAL_TABLET | ORAL | Status: DC

## 2012-09-14 MED ORDER — MEDROXYPROGESTERONE ACETATE 10 MG PO TABS: 10.0000 mg | ORAL_TABLET | Freq: Every day | ORAL | Status: DC

## 2012-09-14 NOTE — Patient Instructions (Addendum)
Clomiphene tablets Qu es este medicamento? El CLOMIFENO es un medicamento para la fertilidad que se utiliza para aumentar la posibilidad de quedar embarazada. Se usa para estimular una ovulacin (producir un huevo maduro) adecuada durante el ciclo de la mujer. Este medicamento puede ser utilizado para otros usos; si tiene alguna pregunta consulte con su proveedor de atencin mdica o con su farmacutico. Qu le debo informar a mi profesional de la salud antes de tomar este medicamento? Necesita saber si usted presenta alguno de los siguientes problemas o situaciones: -enfermedad de la glndula suprarrenal -enfermedad vascular, trastorno de coagulacin sangunea -quiste en los ovarios -endometriosis -enfermedad heptica -carcinoma de ovario -enfermedad de la glndula pituitaria -sangrado vaginal que no ha sido evaluado -una reaccin alrgica o inusual al clomifeno, a otros medicamentos, alimentos, colorantes o conservantes -si est embarazada (no debe usar si est embarazada) -si est amamantando a un beb Cmo debo utilizar este medicamento? Tome este medicamento por va oral con un vaso de agua. Siga las instrucciones de la etiqueta del medicamento. Tomar exactamente segn se indica y durante el nmero exacto de das para los que fue recetado. Tome sus dosis a intervalos regulares. La mayora de las mujeres toman este medicamento durante un perodo de 5 das, pero la duracin del tratamiento puede ajustarse en algunos casos. Su mdico le indicarn el da en que debe empezar a tomar este medicamento y le darn las indicaciones para el seguimiento. No tome su medicamento con una frecuencia mayor que la indicada. Hable con su pediatra para informarse acerca del uso de este medicamento en nios. Puede requerir atencin especial. Sobredosis: Pngase en contacto inmediatamente con un centro toxicolgico o una sala de urgencia si usted cree que haya tomado demasiado medicamento. ATENCIN: Este  medicamento es solo para usted. No comparta este medicamento con nadie. Qu sucede si me olvido de una dosis? Si olvida una dosis, tmela lo antes posible. Si es casi la hora de la prxima dosis, tome slo esa dosis. No tome dosis adicionales o dobles. Qu puede interactuar con este medicamento? -suplementos a base de hierbas o dietticos como cohosh azul, cohosh negro, vitex o DHEA -prasterona Puede ser que esta lista no menciona todas las posibles interacciones. Informe a su profesional de la salud de todos los productos a base de hierbas, medicamentos de venta libre o suplementos nutritivos que est tomando. Si usted fuma, consume bebidas alcohlicas o si utiliza drogas ilegales, indqueselo tambin a su profesional de la salud. Algunas sustancias pueden interactuar con su medicamento. A qu debo estar atento al usar este medicamento? Asegrese que usted sepa cmo y cundo usar este medicamento. Debe saber cundo est ovulando y cundo debe tener relaciones sexuales a fin de incrementar las posibilidades de quedar embarazada. Visite a su mdico o a su profesional de la salud para chequear su evolucin peridicamente. Es posible que deba controlar sus niveles de hormonas en la sangre o que le indique alguna prueba de orina domiciliaria para controlar la ovulacin. Trate de no faltar a las citas. En comparacin con otros tratamientos para la fertilidad, este medicamento no aumenta mucho la posibilidad de tener un embarazo mltiple. Aproximadamente 5 de cada 100 mujeres que toman este medicamento tienen la posibilidad de quedar embarazadas con mellizos. Si piensa que est embarazada deje de tomar este medicamento de inmediato y comunquese con su mdico o con su profesional de la salud. Este medicamento no es para tratamientos a largo plazo. La mayora de las mujeres que se benefician del uso de este   medicamento obtienen resultados dentro de los tres primeros ciclos (meses). Su mdico o su profesional  de la salud volver a evaluar su problema. Este medicamento generalmente se utiliza durante no ms de 6 ciclos de tratamiento. Puede experimentar mareos o somnolencia. No conduzca ni utilice maquinaria ni haga nada que le exija permanecer en estado de alerta hasta que sepa cmo le afecta este medicamento. No se siente ni se ponga de pie con rapidez. Esto reduce el riesgo de mareos o desmayos. El consumir bebidas alcohlicas o el fumar tabaco puede disminuir las posibilidades de quedar embarazada. Limitar o dejar de consumir alcohol o el uso de tabaco durante su tratamiento para la fertilidad. Qu efectos secundarios puedo tener al utilizar este medicamento? Efectos secundarios que debe informar a su mdico o a su profesional de la salud tan pronto como sea posible: -reacciones alrgicas como erupcin cutnea, picazn o urticarias, hinchazn de la cara, labios o lengua -problemas respiratorios -cambios en la visin -retencin de lquidos -nuseas, vmito -hinchazn o dolor plvico -dolor abdominal severo -aumento de peso repentino Efectos secundarios que, por lo general, no requieren atencin mdica (debe informarlos a su mdico o a su profesional de la salud si persisten o si son molestos): -molestia en las mamas -sofocos -molestia plvica leve -nuseas leves Puede ser que esta lista no menciona todos los posibles efectos secundarios. Comunquese a su mdico por asesoramiento mdico sobre los efectos secundarios. Usted puede informar los efectos secundarios a la FDA por telfono al 1-800-FDA-1088. Dnde debo guardar mi medicina? Mantngala fuera del alcance de los nios. Gurdela a temperatura ambiente, entre 15 y 30 grados C (59 y 86 grados F). Protjala del calor, la luz y la humedad. Deseche todo el medicamento que no haya utilizado, despus de la fecha de vencimiento. ATENCIN: Este folleto es un resumen. Puede ser que no cubra toda la posible informacin. Si usted tiene preguntas acerca  de esta medicina, consulte con su mdico, su farmacutico o su profesional de la salud.  2013, [employer]. (03/27/2007 11:32:00 AM) Infertilidad (Infertility) QU ES LA INFERTILIDAD?  La infertilidad normalmente se define como la incapacidad para quedar embarazada luego de un ao de relaciones sexuales regulares sin la utilizacin de mtodos anticonceptivos. O la incapacidad de llevar a trmino un embarazo y tener el beb. La tasa de infertilidad en los [place] es de alrededor del 10%. El embarazo es el resultado de una cadena de sucesos. La mujer debe liberar el vulo de uno de sus ovarios (ovulacin). El vulo debe fertilizarse con el esperma. Luego viaja a travs de las trompas de Falopio hacia el tero (matriz), donde se une a la pared del tero y crece. El hombre debe tener suficiente esperma y el esperma debe unirse con el vulo (fertilizar) en el momento justo. El vulo fertilizado debe luego unirse al interior del tero. Esto parece simple, pero pueden ocurrir muchas cosas que evitan que el embarazo se produzca.  DE QUIN ES EL PROBLEMA?  Un 20% de los casos de infertilidad se deben a problemas del hombres (factores masculinos) y un 65% se debe a problemas de la mujer (factores femeninos). Otras causas pueden ser una combinacin de factores masculinos y femeninos o a causas desconocidas.  CULES SON LAS CAUSAS DE LA INFERTILIDAD EN EL HOMBRE?  La infertilidad en el hombre a menudo se debe a problemas para producir el esperma o hacer que el mismo llegue al vulo. Los problemas de esperma pueden existir desde el nacimiento o desarrollarse ms tarde debido   a enfermedades o lesiones. Algunos hombres no producen esperma, o producen muy poco (oligospermia). Entre otros problemas se incluyen:  Disfuncin sexual  Problemas hormonales o endocrinos.  La edad. La fertilidad del hombre disminuye con la edad, pero no tan temprano como la femenina.  Infecciones.  Problemas  congnitos Defectos de nacimiento, como ausencia de los tubos que transportan el esperma (conductos deferentes).  Problemas genticos o de cromosomas.  Problemas de anticuerpos antiesperma.  Eyaculacin retrgrada (el esperma va hacia la vejiga).  Varicoceles, espermatoceles o tumores en los testculos.  El estilo de vida puede influir en el nmero y la calidad del esperma del hombre.  El alcohol y las drogas pueden reducir temporalmente la calidad del esperma.  Las toxinas ambientales, como los pesticidas y el plomo, pueden ocasionar algunos casos de infertilidad en hombres. CULES SON LAS CAUSAS DE LA INFERTILIDAD EN LA MUJER?   La infertilidad en las mujeres est causada mayormente por problemas con la ovulacin. Sin ovulacin, el vulo no puede fertilizarse.  Seales de problemas de ovulacin son perodos menstruales irregulares o ningn perodo.  Factores simples del estilo de vida, como el estrs, la dieta, o el entrenamiento deportivo, pueden afectar el balance hormonal de una persona.  La edad. La fertilidad comienza a disminuir en las mujeres alrededor de los 30 aos y empeora a partir de los 37.  Mucho menos a menudo, un desequilibrio hormonal por un problema mdico serio como un tumor en la glndula pituitaria, tiroides u otra enfermedad mdica crnica pueden ocasionar problemas de ovulacin.  Infecciones plvicas.  Sndrome de ovarios poliqusticos (aumento de hormonas masculinas, incapacidad de ovular).  Consumo de alcohol o drogas.  Toxinas ambientales, radiacin, pesticidas y ciertos qumicos.  La edad es un factor importante en la infertilidad femenina.  La capacidad de los ovarios de una mujer para producir vulos disminuye con la edad, especialmente despus de los 35 aos. Alrededor de un tercio de las parejas en las que la mujer tiene ms de 35 aos tendr problemas de infertilidad.  En el momento en el que alcanza la menopausia, cuando su perodo menstrual  se detiene, la mujer ya no puede producir vulos ni quedar embarazada.  Otros problemas tambin pueden llevar a la infertilidad en las mujeres. Si las trompas de Falopio estn bloqueadas en uno o los dos extremos, el vulo no puede pasar a travs de los tubos hacia el tero. Tejido cicatrizal (adhesiones) en la pelvis que pueden obstruir las trompas. Esto puede dar como resultado una enfermedad inflamatoria plvica, endometriosis o una ciruga por embarazo ectpico (en la que el vulo fertilizado se ha implantado fuera del tero) o cualquier ciruga plvica o abdominal que ocasione adherencias.  Tumores fibroides o plipos en el tero.  Anormalidades congnitas (de nacimiento) del tero.  Infecciones en el cuello del tero (cervicitis).  Estenosis cervical (estrechamiento).  Mucosidad cervical anormal.  Sndrome poliqustico de los ovarios.  Tener relaciones sexuales muy seguidas (todos los das o 4 a 5 veces por semana).  Obesidad.  Anorexia.  Dficit nutricional.  Mucho ejercicio, con prdida de grasa corporal.  DES. Su madre ha recibido la hormona dietilstilbesterol cuando estaba embarazada de usted. CMO SE ANALIZA LA INFERTILIDAD?  Si ha estado tratando de quedar embarazada sin xito, podra querer buscar ayuda mdica. No debera esperar un ao de intentos sin xito antes de buscar ayuda profesional si:  Tiene mas de 35 aos de edad  Tiene razones para creer que podra tener problemas de fertilidad. Un examen mdico determinar   las causas de infertilidad de la pareja, Normalmente el proceso comienza con:  Exmenes fsicos  Historias clnicas de ambos miembros de la pareja.  Historiales sexuales de ambos miembros de la pareja. Si no hay un problema evidente, como relaciones sexuales en tiempos inadecuados o ausencia de ovulacin, se necesitarn realizar anlisis.   Para el hombre, los anlisis normalmente comienzan con pruebas de semen para observar:  La cantidad de  esperma.  La forma del esperma.  El movimiento del esperma.  Realizar un historial clnico y quirrgico completo.  Examen fsico.  Control de infecciones en los rganos reproductivos. Podrn realizarle anlisis hormonales.   Para la mujer, el primer paso del anlisis es saber si ovula cada mes. Hay diferentes modos de realizar esto. Por ejemplo, puede llevar un registro de los cambios en la temperatura corporal por la maana y la textura de la mucosidad cervical. Otra herramienta es un kit de prueba de ovulacin casero, que puede comprarse en la farmacia.  Tambin pueden realizarse controles de ovulacin en el consultorio mdico, mediante anlisis de sangre para observar los niveles de hormonas o pruebas de ultrasonido en los ovarios. Si la mujer est ovulando, se necesitarn realizar ms exmenes. Algunas femeninas comunes incluyen:  Histerosalpingografa: Se realizan rayos x de las trompas de Falopio y el tero con una inyeccin de contraste. Mostrar si las trompas estn abiertas y la forma del tero.  Laparoscopa: Un anlisis de las trompas y otros rganos femeninos para encontrar enfermedades. Se utiliza un tubo con iluminacin llamado laparoscopio para observar el interior del abdomen.  Biopsia endometrial: Se toma una muestra del tejido del tero el primer da del perodo menstrual, para ver si el tejido le indica si est ovulando.  Prueba de ultrasonido transvaginal: Examina los rganos femeninos.  Histeroscopa: Utiliza un tubo con iluminacin para examinar el cuello del tero y el tero y ver si hay anormalidades dentro del mismo. TRATAMIENTO Dependiendo de los resultados de las pruebas, se sugerirn tratamientos diferentes. El tratamiento depende de la causa. Entre el 85 y el 90% de los casos de infertilidad se tratan con drogas o ciruga.   Hay varias drogas para la fertilidad que pueden utilizarse para las mujeres con problemas de ovulacin. Es importante hablar con el  profesional que la asiste sobre la droga a utilizar. Deber comprender los beneficios y efectos colaterales de las drogas. Segn el tipo de droga para la fertilidad y la dosis utilizada, algunas mujeres podran tener embarazos mltiples (mellizos).  De ser necesario, se puede realizar una ciruga para reparar daos en los ovarios de la mujer, trompas de Falopio, el cuello del tero o el tero.  Tratamiento quirrgico o mdico para la endometriosis o el sndrome de ovario poliqustico. A veces, los problemas de fertilidad en el hombre pueden corregirse con medicamentos o ciruga.  Inseminacin intrauterina, IUI, de esperma en concordancia con la ovulacin.  Cambios en el estilo de vida, si esta es la causa (prdida de peso, aumento de ejercicios, dejar de fumar, beber excesivamente o tomar drogas ilegales).  Otros tipos de ciruga:  Extirpar tumores por dentro o sobre el tero.  Eliminar tejido cicatrizal de dentro del tero.  Arreglar trompas obstruidas.  Eliminar tejido cicatrizal en la pelvis y alrededor de los rganos femeninos. QU ES LA TECNOLOGA DE REPRODUCCIN ASISTIDA (ART)?  La tecnologa de reproduccin asistida (ART) utiliza mtodos especiales para ayudar a parejas infrtiles. En esta tcnica se manipula tanto el vulo de la mujer como el esperma del hombre. El xito   depende de muchos factores. La ART puede ser cara y demandar mucho tiempo. Pero ha hecho posible que muchas parejas tengan hijos que de otra manera no hubieran podido. A continuacin se enumeran algunos mtodos:  Fertilizacin. In Vitro (FIV). In Vitro (IVF) es un procedimiento que se hizo famoso con el nacimiento en 1978 de Louise Brown, el primer "beb de probeta" del mundo. Se utiliza cuando las trompas de Falopio de la mujer estn bloqueadas o cuando el hombre tiene poco esperma. Se utiliza una droga para estimular los ovarios y producir mltiples vulos. Una vez maduros, los vulos se retiran y se colocan en una  placa de cultivo con el esperma del hombre para la fertilizacin. Luego de aproximadamente 40 horas, los vulos se examinan para ver si han sido fertilizados por el esperma y se han dividido en clulas. Esos vulos fertilizados (embriones) se colocan luego en el tero de la mujer. Esto evita el paso por las trompas de Falopio.  La transferencia intrafalopiana de gametas (GIFT) es similar al IVF, pero se utiliza cuando la mujer tiene al menos una trompa de falopio normal. Se colocan de tres a cinco vulos en la trompa de Falopio, junto con el esperma del hombre, para que la fertilizacin se realice dentro del cuerpo de la mujer.  La transferencia intrafalopiana de cigotos (ZIFT), tambin se denomina transferencia embrionaria, y combina el IVF con el GIFT. Los vulos retirados de los ovarios de la mujer se fertilizan en el laboratorio y se colocan en las trompas de Falopio en lugar de en el tero.  Los procedimientos de ART a menudo suponen la utilizacin de donantes de vulos (de otra mujer) o de embriones congelados previamente. Los donantes de vulos pueden utilizarse si la mujer tiene problemas en los ovarios o posee una enfermedad gentica que podra transmitirla al beb.  Cuando se realiza una ART hay mayor riesgo de embarazos mltiples, mellizos, trillizos o ms.  La inyeccin de esperma intracitoplasmtica es un procedimiento que inyecta un espermatozoide en el vulo para fertilizarlo.  El trasplante embrionario es un procedimiento que comienza con un embrin que se ha desarrollado en un medio especial (solucin qumica) preparado para mantener el embrin vivo por 2 a 5 das, y luego trasplantarlo en el tero. En los casos en los que no puede encontrarse la causa y el embarazo no se produce, podr considerarse la adopcin. Document Released: 10/30/2007 Document Revised: 01/02/2012 [employer] Patient Information 2013 [employer].  

## 2012-09-14 NOTE — Progress Notes (Signed)
Patient is a 21 year old gravida 0 who presented to the office today stating that her cycle was delayed by 2 months and started last week and she had right lower quadrant discomfort for 3-4 days afterwards. She was seen in the office on June 6 of this year and had stated that 3 weeks prior to that visit she had been in the emergency room complaining of on and off right lower abdominal pain radiating to her back. She had a normal CT of the abdomen and pelvis at that time. Patient has had history of oligomenorrhea and has had a normal TSH and prolactin this year. She had been on oral contraceptive pills but discontinued it several months ago because she wants to get pregnant. She was in no pain today.  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 Recent normal TSH and prolactin. Patient overweight with a BMI of 32. We had a lengthy discussion of diagnosis and treatment of infertility. We discussed importance of regular exercise 45 minutes 3 or 4 times a week. We discussed ovulation induction medication. The risks benefits and pros and cons to include hyperstimulation and multiple gestation. Patient would like to try ovulation induction medication before proceeding with any further evaluation due to limited financial resources. She will wait for 30 days from last menstrual cycle and if she does not have a period she will take Provera 10 mg for 5-10 days to start her cycle. From day 5 through 9 she will begin clomiphene citrate 100 mg and then from day 12 through 16 she will use the ovulation predictor kit to time or intercourse. She will return back to the office on day 20 or 21 or 22 for her serum progesterone level and  then to see me in consultation one week afterwards. All the above were discussed with the patient in detail in Spanish. Literature information on clomiphene citrate was provided as well as on infertility. All questions were answered in Spanish and we will follow accordingly.

## 2012-10-26 ENCOUNTER — Ambulatory Visit (INDEPENDENT_AMBULATORY_CARE_PROVIDER_SITE_OTHER): Payer: Self-pay | Admitting: Gynecology

## 2012-10-26 ENCOUNTER — Encounter: Payer: Self-pay | Admitting: Gynecology

## 2012-10-26 VITALS — BP 110/78

## 2012-10-26 DIAGNOSIS — E282 Polycystic ovarian syndrome: Secondary | ICD-10-CM

## 2012-10-26 DIAGNOSIS — N949 Unspecified condition associated with female genital organs and menstrual cycle: Secondary | ICD-10-CM

## 2012-10-26 DIAGNOSIS — N938 Other specified abnormal uterine and vaginal bleeding: Secondary | ICD-10-CM

## 2012-10-26 LAB — PREGNANCY, URINE: Preg Test, Ur: NEGATIVE

## 2012-10-26 MED ORDER — LEVONORGESTREL-ETHINYL ESTRAD 0.1-20 MG-MCG PO TABS: 1.0000 | ORAL_TABLET | Freq: Every day | ORAL | Status: DC

## 2012-10-26 NOTE — Patient Instructions (Signed)
Informacin sobre los [employer]  (Oral Contraception Information) Los anticonceptivos orales son medicamentos que se utilizan para Location manager. Su funcin es [employer] ovarios liberen vulos. Las hormonas de los anticonceptivos orales hacen que el moco cervical se haga ms espeso, lo que evita que el esperma ingrese al tero. Tambin hacen que la membrana que tapiza el tero se vuelva ms fina, lo que no permite que el huevo fertilizado se adhiera a la pared del tero. Los anticonceptivos orales son muy efectivos cuando se toman exactamente como se prescriben. Sin embargo, no previenen contra las enfermedades de transmisin sexual (ETS). La prctica del sexo seguro, como el uso de preservativos, junto con la [place], [place] a prevenir ese tipo de enfermedades. Antes de tomar la pldora, usted debe hacerse un examen fsico y un test de Pap. El mdico podr indicar anlisis de sangre si es necesario. El mdico se asegurar de que usted es una buena candidata para usar anticonceptivos orales. Converse con su mdico acerca de los posibles efectos secundarios de los anticonceptivos [place]. Cuando se inicia el uso de anticonceptivos [place], se pueden tomar durante 2 a 3 meses para que el cuerpo se adapte a los cambios en los niveles hormonales en el cuerpo.  HAY DOS TIPOS DE ANTICONCEPTIVOS ORALES  La pldora combinada. Esta pldora contiene las hormonas estrgeno y progestina (progesterona sinttica). La pldora combinada viene en envases para [address] o [address]. En los envases para [address] usted no tomar las pldoras durante 7 [address]  [address] despus de la ltima pldora. En los envases para [address] la pldora se toma [employer]. Las ltimas 7 no contienen hormonas. Ciertos tipos de pldoras tienen ms de 21 pldoras que contienen hormonas.  La minipldora. Esta pldora contiene la hormona progesterona solamente. Es necesario tomarla todos los  das de [place] continua. Viene en envases de 91 pldoras. Las primeras 84 contienen hormonas y las 7 ltimas no contienen. En los ltimos 7 das usted tendr su perodo menstrual. Puede experimentar manchado irregular. VENTAJAS   Disminuye los sntomas premenstruales.  Se [place] para tratar los [employer].  Regula el ciclo menstrual.  Disminuye el ciclo menstrual abundante.  Trata el acn.  Trata hemorragias uterinas anormales.  Trata el dolor plvico crnico.  Trata el sndrome ovrico poliqustico.  Trata la endometriosis.  Pueden usarse como anticonceptivos de Sport and exercise psychologist  Pueden ser menos efectivos si:   Olvid tomar la [employer] a la misma hora.  Usted tiene una enfermedad estomacal o intestinal que disminuye la absorcin de la pldora.  Toma anticonceptivos orales junto con otros frmacos que [employer] anticonceptivos sean menos eficaces.  Usted toma anticonceptivos orales que han vencido.  Cuando se [place] el envase de [place], se olvida de recomenzar el uso [employer] 7. Document Released: 07/20/2005 Document Revised: 01/02/2012 [facility] Patient Information 2013 [place], [place].  Ejercicios para perder peso (Exercise to Lose Weight) La actividad fsica y [place] Dear dieta saludable ayudan a perder peso. El mdico podr sugerirle ejercicios especficos. IDEAS Y CONSEJOS PARA HACER EJERCICIOS  Elija opciones econmicas que disfrute hacer , como caminar, andar en bicicleta o los vdeos para ejercitarse.   Utilice las Microbiologist del ascensor.   Camine durante la hora del almuerzo.   Estacione el auto lejos del lugar de [place] o [place].   Concurra a un gimnasio o tome clases de gimnasia.   Comience con 5  10 minutos de actividad fsica por da. Ejercite  hasta 30 minutos, 4 a 6 das por [address]   Utilice zapatos que tengan un buen soporte y ropas cmodas.   Elongue antes y despus de Company secretary.   Ejercite hasta que aumente  la respiracin y el corazn palpite rpido.   Beba agua extra cuando ejercite.   No haga ejercicio Firefighter, sentirse mareado o que le falte mucho el aire.  La actividad fsica puede quemar alrededor de 150 caloras.  Correr 20 cuadras en 15 minutos.   Jugar vley durante 45 a 60 minutos.   Limpiar y encerar el auto durante 45 a 60 minutos.   Jugar ftbol americano de toque.   Caminar 25 cuadras en 35 minutos.   Empujar un cochecito 20 cuadras en 30 minutos.   Jugar baloncesto durante 30 minutos.   Rastrillar hojas secas durante 30 minutos.   Andar en bicicleta 80 cuadras en 30 minutos.   Caminar 30 cuadras en 30 minutos.   Bailar durante 30 minutos.   Quitar la nieve con una pala durante 15 minutos.   Nadar vigorosamente durante 20 minutos.   Subir escaleras durante 15 minutos.   Andar en bicicleta 60 cuadras durante 15 minutos.   Arreglar el jardn entre 30 y 45 minutos.   Saltar a la soga durante 15 minutos.   Limpiar vidrios o pisos durante 45 a 60 minutos.  Document Released: 01/14/2011 Document Revised: 06/22/2011 [facility] Patient Information 2012 [place], [place].                                                   Control del colesterol  Los niveles de colesterol en el organismo estn determinados significativamente por su dieta. Los niveles de colesterol tambin se relacionan con la enfermedad cardaca. El material que sigue ayuda a Software engineer relacin y a Chiropractor qu puede hacer para mantener su corazn sano. No todo el colesterol es [place]. Las lipoprotenas de baja densidad (LDL) forman el colesterol "malo". El colesterol malo puede ocasionar depsitos de grasa que se acumulan en el interior de las arterias. Las lipoprotenas de alta densidad (HDL) es el colesterol "bueno". Ayuda a remover el colesterol LDL "malo" de la [place]. El colesterol es un factor de riesgo muy importante para la enfermedad cardaca. Otros factores de riesgo son la hipertensin  arterial, el hbito de fumar, el estrs, la herencia y [place].  El msculo cardaco obtiene el suministro de sangre a travs de las arterias coronarias. Si su colesterol LDL ("malo") est elevado y el HDL ("bueno") es bajo, tiene un factor de riesgo para que se formen depsitos de Holiday representative en las arterias coronarias (los vasos sanguneos que suministran sangre al corazn). Esto hace que haya menos lugar para que la sangre circule. Sin la suficiente sangre y oxgeno, el msculo cardaco no puede funcionar correctamente, y usted podr sentir dolores en el pecho (angina pectoris). Cuando una arteria coronaria se cierra completamente, una parte del msculo cardaco puede morir (infarto de miocardio). CONTROL DEL COLESTEROL Cuando el profesional que lo asiste enva la sangre al laboratorio para Artist nivel de colesterol, puede realizarle tambin un perfil completo de los lpidos. Con esta prueba, se puede determinar la cantidad total de colesterol, as como los niveles de LDL y HDL. Los triglicridos son un tipo de grasa que circula en la sangre y que tambin puede utilizarse para Chief Strategy Officer  el riesgo de enfermedad cardaca. En la siguiente tabla se establecen los nmeros ideales: Prueba: Colesterol total  Menos de 200 mg/dl.  Prueba: LDL "colesterol malo"  Menos de 100 mg/dl.   Menos de 70 mg/dl si tiene riesgo muy elevado de sufrir un ataque cardaco o muerte cardaca sbita.  Prueba: HDL "colesterol bueno"  Mujeres: Ms de 50 mg/dl.   Hombres: Ms de 40 mg/dl.  Prueba: Trigliceridos  Menos de 150 mg/dl.  CONTROL DEL COLESTEROL CON DIETA Aunque factores como el ejercicio y el estilo de vida son importantes, la "primera lnea de ataque" es la dieta. Esto se debe a que se sabe que ciertos alimentos hacen subir el colesterol y otros lo [place]. El objetivo debe ser [employer] alimentos, de modo que tengan un efecto sobre el colesterol y, an ms importante, Microbiologist las grasas saturadas y trans  con otros tipos de grasas, como las monoinsaturadas y las poliinsaturadas y cidos grasos omega-3 . En promedio, una persona no debe consumir ms de 15 a 17 g de grasas saturadas por [employer]. Las grasas saturadas y trans se consideran grasas "malas", ya que elevan el colesterol LDL. Las grasas saturadas se encuentran principalmente en productos animales como carne, [place] y crema. Pero esto no significa que usted Marketing executive todas sus comidas favoritas. Actualmente, como lo muestra el cuadro que figura al final de este documento, hay sustitutos de buen sabor, bajos en grasas y en colesterol, para la mayora de los alimentos que a usted Musician. Elija aquellos alimentos alternativos que sean bajos en grasas o sin grasas. Elija cortes de carne del cuarto trasero o lomo ya que estos cortes son los que tienen menor cantidad de grasa y Oncologist. El pollo (sin piel), el pescado, la carne de ternera, y la [place] de [place] molida son excelentes opciones. Elimine las carnes [employer] Foods o el salami. Los [employer] o nada de grasas saturadas. Cuando consuma carne [place], carne de aves de corral, o pescado, hgalo en porciones de 85 gramos (3 onzas). Las grasas trans tambin se llaman "aceites parcialmente hidrogenados". Son aceites manipulados cientficamente de [place] que son slidos a Publishing rights manager, tienen una larga vida y Glass blower/designer sabor y la textura de los alimentos a los que se Scientist, clinical (histocompatibility and immunogenetics). Las grasas trans se encuentran en la [place], [place], crackers y alimentos horneados.  Para hornear y cocinar, el aceite es un excelente sustituto para la [place]. Los aceites monoinsaturados tienen un beneficio particular, ya que se cree que disminuyen el colesterol LDL (colesterol malo) y elevan el HDL. Deber evitar los aceites tropicales saturados como el de coco y el de [place].  Recuerde, adems, que puede comer sin restricciones los grupos de alimentos que son naturalmente libres de  grasas saturadas y Neurosurgeon trans, entre los que se incluyen el pescado, las frutas (excepto el Olivia Lopez de Gutierrez), verduras, frijoles, cereales (cebada, arroz, [place], trigo) y las pastas (sin salsas con crema)  IDENTIFIQUE LOS ALIMENTOS QUE DISMINUYEN EL COLESTEROL  Pueden disminuir el colesterol las fibras solubles que estn en las frutas, como las [place], en los vegetales como el brcoli, las patatas y las zanahorias; en las legumbres como frijoles, guisantes y Therapist, occupational; y en los cereales como la cebada. Los alimentos fortificados con fitosteroles tambin Engineer, production. Debe consumir al menos 2 g de estos alimentos a diario para Financial planner de disminucin de [place].  En el supermercado, lea las etiquetas de los envases para identificar los alimentos bajos en grasas  saturadas, libres de grasas trans y bajos en grasas, . Elija quesos que tengan solo de 2 a 3 g de grasa saturada por onza (28,35 g). Use una margarina que no dae el corazn, [place] de grasas trans o aceite parcialmente hidrogenado. Al comprar alimentos horneados (galletitas dulces y Gaffer) evite el aceite parcialmente hidrogenado. Los panes y bollos debern ser de granos enteros (harina de maz o de avena entera, en lugar de "harina" o "harina enriquecida"). Compre sopas en lata que no sean cremosas, con bajo contenido de sal y sin grasas adicionadas.  TCNICAS DE PREPARACIN DE LOS ALIMENTOS  Nunca fra los alimentos en aceite abundante. Si debe frer, hgalo en poco aceite y removiendo [place], porque as se utilizan muy pocas grasas, o utilice un spray antiadherente. Cuando le sea posible, hierva, hornee o ase las carnes y cocine los vegetales al vapor. En vez de [employer] con mantequilla o [place], utilice limn y hierbas, pur de Psychologist, educational y canela (para las calabazas y batatas), yogurt y salsa descremados y aderezos para ensaladas bajos en contenido graso.  BAJO EN GRASAS SATURADAS / SUSTITUTOS  BAJOS EN GRASA  Carnes / Grasas saturadas (g)  Evite: Bife, corte graso (3 oz/85 g) / 11 g   Elija: Bife, corte magro (3 oz/85 g) / 4 g   Evite: Hamburguesa (3 oz/85 g) / 7 g   Elija:  [place] magra (3 oz/85 g) / 5 g   Evite: Jamn (3 oz/85 g) / 6 g   Elija:  Jamn magro (3 oz/85 g) / 2.4 g   Evite: Pollo, con piel (3 oz/85 g), Carne oscura / 4 g   Elija:  Pollo, sin piel (3 oz/85 g), Carne oscura / 2 g   Evite: Pollo, con piel (3 oz/85 g), Carne magra / 2.5 g   Elija: Pollo, sin piel (3 oz/85 g), Carne magra / 1 g  Lcteos / Grasas saturadas (g)  Evite: Leche entera (1 taza) / 5 g   Elija: Leche con bajo contenido de grasa, 2% (1 taza) / 3 g   Elija: Leche con bajo contenido de grasa, 1% (1 taza) / 1.5 g   Elija: Leche descremada (1 taza) / 0.3 g   Evite: Queso duro (1 oz/28 g) / 6 g   Elija: Queso descremado (1 oz/28 g) / 2-3 g   Evite: Queso cottage, 4% grasa (1 taza)/ 6.5 g   Elija: Queso cottage con bajo contenido de grasa, 1% grasa (1 taza)/ 1.5 g   Evite: Helado (1 taza) / 9 g   Elija: Sorbete (1 taza) / 2.5 g   Elija: Yogurt helado sin contenido de grasa (1 taza) / 0.3 g   Elija: Barras de fruta congeladas / vestigios   Evite: Crema batida (1 cucharada) / 3.5 g   Elija: Batidos glac sin lcteos (1 cucharada) / 1 g  Condimentos / Grasas saturadas (g)  Evite: Mayonesa (1 cucharada) / 2 g   Elija: Mayonesa con bajo contenido de grasa (1 cucharada) / 1 g   Evite: [place] (1 cucharada) / 7 g   Elija: Margarina extra light (1 cucharada) / 1 g   Evite: Aceite de coco (1 cucharada) / 11.8 g   Elija: Aceite de oliva (1 cucharada) / 1.8 g   Elija: Aceite de maz (1 cucharada) / 1.7 g   Elija: Aceite de crtamo (1 cucharada) / 1.2 g   Elija: Aceite de girasol (1 cucharada) / 1.4 g   Elija: Aceite  de soja (1 cucharada) / 2.4 g   Elija: Aceite de canola (1 cucharada) / 1 g  Document Released: 10/10/2005 Document Revised: 06/22/2011 [facility]  Patient Information 2012 [place], [place].

## 2012-10-26 NOTE — Progress Notes (Signed)
The patient is a 22 year old gravida 0 who is been seen the office in 09/14/2012. Patient overweight with history of oligomenorrhea with recent TSH and FSH in the normal range. See detail note from that last office visit. She was interested in conceiving and was going to be placed on ovulation induction agent clomiphene citrate 50 mg from day 5 through 9 after withdrawing either spontaneously or with Provera. She stated that she had a spontaneous normal menses November 9 and then she had another menstrual cycle December 6-11. Towards the end of the month of December 24 she had a cycle for 7 days which ended December 31. She had some slight breast tenderness. We did do a urine pregnancy test in the office which was negative. She has informed me now that she does not want to get pregnant and would like to go back on the oral contraceptive pills ( Alesse) which she had been on in the past to help regulate her cycles.  Exam: Abdomen: Soft nontender no rebound or guarding Pelvic: Bartholin urethra Skene was within normal limits Vagina: No lesions or discharge Cervix: No lesions or discharge Uterus: Anteverted normal size shape and consistency Adnexa: No palpable masses or tenderness Rectal exam not done  Assessment/plan: Patient with history of oligomenorrhea in the past contribute to her weight possibly underline PCO S. Patient has responded well with oral contraceptive pill to regulate her cycle. She wishes now to proceed that route and postpone pregnancy to a later date. Patient would know prior history of blood clots or family history of bleeding disorders. Patient had been on oral contraceptive pill the past without any problem. Her urine pregnancy test was negative. She was given literature information on diet as well as on exercise and on oral contraceptive pill in Spanish. She will be prescribed  Alesse 28 day oral contraceptive pill. She scheduled to see me back in 6 months for her annual exam and  I've asked her to keep a menstrual calendar for me.

## 2013-05-20 ENCOUNTER — Encounter: Payer: Self-pay | Admitting: Gynecology

## 2013-05-20 ENCOUNTER — Ambulatory Visit (INDEPENDENT_AMBULATORY_CARE_PROVIDER_SITE_OTHER): Payer: Self-pay | Admitting: Gynecology

## 2013-05-20 VITALS — BP 120/78 | Ht <= 58 in | Wt 156.0 lb

## 2013-05-20 DIAGNOSIS — Z01419 Encounter for gynecological examination (general) (routine) without abnormal findings: Secondary | ICD-10-CM

## 2013-05-20 DIAGNOSIS — N921 Excessive and frequent menstruation with irregular cycle: Secondary | ICD-10-CM

## 2013-05-20 DIAGNOSIS — E282 Polycystic ovarian syndrome: Secondary | ICD-10-CM

## 2013-05-20 DIAGNOSIS — E663 Overweight: Secondary | ICD-10-CM

## 2013-05-20 LAB — LIPID PANEL
Cholesterol: 188 mg/dL (ref 0–200)
LDL Cholesterol: 118 mg/dL — ABNORMAL HIGH (ref 0–99)
Total CHOL/HDL Ratio: 4.9 Ratio
VLDL: 32 mg/dL (ref 0–40)

## 2013-05-20 LAB — CBC WITH DIFFERENTIAL/PLATELET
Eosinophils Absolute: 0.4 10*3/uL (ref 0.0–0.7)
Eosinophils Relative: 5 % (ref 0–5)
HCT: 41.8 % (ref 36.0–46.0)
Hemoglobin: 14.2 g/dL (ref 12.0–15.0)
Lymphocytes Relative: 39 % (ref 12–46)
Lymphs Abs: 3.2 10*3/uL (ref 0.7–4.0)
MCH: 29.1 pg (ref 26.0–34.0)
MCV: 85.7 fL (ref 78.0–100.0)
Monocytes Relative: 6 % (ref 3–12)
RBC: 4.88 MIL/uL (ref 3.87–5.11)
WBC: 8.1 10*3/uL (ref 4.0–10.5)

## 2013-05-20 LAB — PREGNANCY, URINE: Preg Test, Ur: NEGATIVE

## 2013-05-20 LAB — TSH: TSH: 2.342 u[IU]/mL (ref 0.350–4.500)

## 2013-05-20 MED ORDER — LEVONORGEST-ETH ESTRAD 91-DAY 0.15-0.03 &0.01 MG PO TABS: 1.0000 | ORAL_TABLET | Freq: Every day | ORAL | Status: DC

## 2013-05-20 NOTE — Progress Notes (Signed)
Amber Warren 02-Mar-1991 [id number]   History:    22 y.o.  for annual gyn exam who presented to the office today stating that since April of this year she's been having 2 periods a month. Patient has had a history of oligomenorrhea in the past. Patient one time was interested in getting pregnant and she has decided to put that on hold for now. She stated that she missed her pill maybe once in the past 6 months. Her urine pregnancy test was negative in the office. Patient denied any nausea vomiting. Review of her records indicated she was weighing 148 and is now weighing 156. Patient states that she can't eat better and exercise regularly. Patient with no prior history of abnormal Pap smear. Patient had been on a 20 mcg oral contraceptive pill (alesse).   Past medical history,surgical history, family history and social history were all reviewed and documented in the EPIC chart.  Gynecologic History Patient's last menstrual period was 04/25/2013. Contraception: OCP (estrogen/progesterone) Last Pap: 2013. Results were: normal Last mammogram: when indicated. Results were: none indicated  Obstetric History OB History   Grav Para Term Preterm Abortions TAB SAB Ect Mult Living   0                ROS: A ROS was performed and pertinent positives and negatives are included in the history.  GENERAL: No fevers or chills. HEENT: No change in vision, no earache, sore throat or sinus congestion. NECK: No pain or stiffness. CARDIOVASCULAR: No chest pain or pressure. No palpitations. PULMONARY: No shortness of breath, cough or wheeze. GASTROINTESTINAL: No abdominal pain, nausea, vomiting or diarrhea, melena or bright red blood per rectum. GENITOURINARY: No urinary frequency, urgency, hesitancy or dysuria. MUSCULOSKELETAL: No joint or muscle pain, no back pain, no recent trauma. DERMATOLOGIC: No rash, no itching, no lesions. ENDOCRINE: No polyuria, polydipsia, no heat or cold intolerance. No recent change in  weight. HEMATOLOGICAL: No anemia or easy bruising or bleeding. NEUROLOGIC: No headache, seizures, numbness, tingling or weakness. PSYCHIATRIC: No depression, no loss of interest in normal activity or change in sleep pattern.     Exam: chaperone present  BP 120/78  Ht 4' 8.75" (1.441 m)  Wt 156 lb (70.761 kg)  BMI 34.08 kg/m2  LMP 04/25/2013  Body mass index is 34.08 kg/(m^2).  General appearance : Well developed well nourished female. No acute distress HEENT: Neck supple, trachea midline, no carotid bruits, no thyroidmegaly Lungs: Clear to auscultation, no rhonchi or wheezes, or rib retractions  Heart: Regular rate and rhythm, no murmurs or gallops Breast:Examined in sitting and supine position were symmetrical in appearance, no palpable masses or tenderness,  no skin retraction, no nipple inversion, no nipple discharge, no skin discoloration, no axillary or supraclavicular lymphadenopathy Abdomen: no palpable masses or tenderness, no rebound or guarding Extremities: no edema or skin discoloration or tenderness  Pelvic:  Bartholin, Urethra, Skene Glands: Within normal limits             Vagina: No gross lesions or discharge  Cervix: No gross lesions or discharge  Uterus  anteverted, normal size, shape and consistency, non-tender and mobile  Adnexa  Without masses or tenderness  Anus and perineum  normal   Rectovaginal  normal sphincter tone without palpated masses or tenderness             Hemoccult none indicated   Negative urine pregnancy test  Assessment/Plan:  22 y.o. female for annual exam with past history of oligomenorrhea/PCO S who  had been on oral contraceptive pill has reported 2 cycles per month. She stated that her irregular bleeding use and was started at the first half of her birth control pill pack. She will be changed to an oral contraceptive pill with a slightly higher estrogen component  such as in [place] which has 30 mcg of ethinyl estradiol. Patient will wait  to the start of her next cycle and began on the second day. She was given instructions on self breast examination. The following labs were ordered: TSH, hemoglobin A1c, fasting lipid profile, CBC and urinalysis. No Pap smear done today the new guidelines were discussed. Literature and information on the following was provided in Spanish: Diet: Cholesterol lowering diet, and exercise.    Ok Edwards MD, 10:00 AM 05/20/2013

## 2013-05-20 NOTE — Patient Instructions (Addendum)
Control del colesterol  Los niveles de colesterol en el organismo estn determinados significativamente por su dieta. Los niveles de colesterol tambin se relacionan con la enfermedad cardaca. El material que sigue ayuda a explicar esta relacin y a analizar qu puede hacer para mantener su corazn sano. No todo el colesterol es malo. Las lipoprotenas de baja densidad (LDL) forman el colesterol "malo". El colesterol malo puede ocasionar depsitos de grasa que se acumulan en el interior de las arterias. Las lipoprotenas de alta densidad (HDL) es el colesterol "bueno". Ayuda a remover el colesterol LDL "malo" de la sangre. El colesterol es un factor de riesgo muy importante para la enfermedad cardaca. Otros factores de riesgo son la hipertensin arterial, el hbito de fumar, el estrs, la herencia y el peso.   El msculo cardaco obtiene el suministro de sangre a travs de las arterias coronarias. Si su colesterol LDL ("malo") est elevado y el HDL ("bueno") es bajo, tiene un factor de riesgo para que se formen depsitos de grasa en las arterias coronarias (los vasos sanguneos que suministran sangre al corazn). Esto hace que haya menos lugar para que la sangre circule. Sin la suficiente sangre y oxgeno, el msculo cardaco no puede funcionar correctamente, y usted podr sentir dolores en el pecho (angina pectoris). Cuando una arteria coronaria se cierra completamente, una parte del msculo cardaco puede morir (infarto de miocardio).  CONTROL DEL COLESTEROL Cuando el profesional que lo asiste enva la sangre al laboratorio para conocer el nivel de colesterol, puede realizarle tambin un perfil completo de los lpidos. Con esta prueba, se puede determinar la cantidad total de colesterol, as como los niveles de LDL y HDL. Los triglicridos son un tipo de grasa que circula en la sangre y que tambin puede utilizarse para determinar el riesgo de enfermedad  cardaca. En la siguiente tabla se establecen los nmeros ideales: Prueba: Colesterol total  Menos de 200 mg/dl.  Prueba: LDL "colesterol malo"  Menos de 100 mg/dl.   Menos de 70 mg/dl si tiene riesgo muy elevado de sufrir un ataque cardaco o muerte cardaca sbita.  Prueba: HDL "colesterol bueno"  Mujeres: Ms de 50 mg/dl.   Hombres: Ms de 40 mg/dl.  Prueba: Trigliceridos  Menos de 150 mg/dl.    CONTROL DEL COLESTEROL CON DIETA Aunque factores como el ejercicio y el estilo de vida son importantes, la "primera lnea de ataque" es la dieta. Esto se debe a que se sabe que ciertos alimentos hacen subir el colesterol y otros lo bajan. El objetivo debe ser equilibrar los alimentos, de modo que tengan un efecto sobre el colesterol y, an ms importante, reemplazar las grasas saturadas y trans con otros tipos de grasas, como las monoinsaturadas y las poliinsaturadas y cidos grasos omega-3 . En promedio, una persona no debe consumir ms de 15 a 17 g de grasas saturadas por da. Las grasas saturadas y trans se consideran grasas "malas", ya que elevan el colesterol LDL. Las grasas saturadas se encuentran principalmente en productos animales como carne, manteca y crema. Pero esto no significa que usted debe sacrificar todas sus comidas favoritas. Actualmente, como lo muestra el cuadro que figura al final de este documento, hay sustitutos de buen sabor, bajos en grasas y en colesterol, para la mayora de los alimentos que a usted le gusta comer. Elija aquellos alimentos alternativos que sean bajos en grasas o sin grasas. Elija cortes de carne del cuarto trasero o lomo ya que estos cortes son los que tienen menor cantidad de grasa   y colesterol. El pollo (sin piel), el pescado, la carne de ternera, y la pechuga de pavo molida son excelentes opciones. Elimine las carnes grasosas como los hotdogs o el salami. Los mariscos tienen poco o nada de grasas saturadas. Cuando consuma carne magra, carne de aves de  corral, o pescado, hgalo en porciones de 85 gramos (3 onzas). Las grasas trans tambin se llaman "aceites parcialmente hidrogenados". Son aceites manipulados cientficamente de modo que son slidos a temperatura ambiente, tienen una larga vida y mejoran el sabor y la textura de los alimentos a los que se agregan. Las grasas trans se encuentran en la margarina, masitas, crackers y alimentos horneados.  Para hornear y cocinar, el aceite es un excelente sustituto para la mantequilla. Los aceites monoinsaturados tienen un beneficio particular, ya que se cree que disminuyen el colesterol LDL (colesterol malo) y elevan el HDL. Deber evitar los aceites tropicales saturados como el de coco y el de palma.  Recuerde, adems, que puede comer sin restricciones los grupos de alimentos que son naturalmente libres de grasas saturadas y grasas trans, entre los que se incluyen el pescado, las frutas (excepto el aguacate), verduras, frijoles, cereales (cebada, arroz, cuzcuz, trigo) y las pastas (sin salsas con crema)   IDENTIFIQUE LOS ALIMENTOS QUE DISMINUYEN EL COLESTEROL  Pueden disminuir el colesterol las fibras solubles que estn en las frutas, como las manzanas, en los vegetales como el brcoli, las patatas y las zanahorias; en las legumbres como frijoles, guisantes y lentejas; y en los cereales como la cebada. Los alimentos fortificados con fitosteroles tambin pueden disminuir el colesterol. Debe consumir al menos 2 g de estos alimentos a diario para obtener el efecto de disminucin de colesterol.  En el supermercado, lea las etiquetas de los envases para identificar los alimentos bajos en grasas saturadas, libres de grasas trans y bajos en grasas, . Elija quesos que tengan solo de 2 a 3 g de grasa saturada por onza (28,35 g). Use una margarina que no dae el corazn, libre de grasas trans o aceite parcialmente hidrogenado. Al comprar alimentos horneados (galletitas dulces y galletas) evite el aceite parcialmente  hidrogenado. Los panes y bollos debern ser de granos enteros (harina de maz o de avena entera, en lugar de "harina" o "harina enriquecida"). Compre sopas en lata que no sean cremosas, con bajo contenido de sal y sin grasas adicionadas.   TCNICAS DE PREPARACIN DE LOS ALIMENTOS  Nunca fra los alimentos en aceite abundante. Si debe frer, hgalo en poco aceite y removiendo constantemente, porque as se utilizan muy pocas grasas, o utilice un spray antiadherente. Cuando le sea posible, hierva, hornee o ase las carnes y cocine los vegetales al vapor. En vez de aderezar los vegetales con mantequilla o margarina, utilice limn y hierbas, pur de manzanas y canela (para las calabazas y batatas), yogurt y salsa descremados y aderezos para ensaladas bajos en contenido graso.   BAJO EN GRASAS SATURADAS / SUSTITUTOS BAJOS EN GRASA  Carnes / Grasas saturadas (g)  Evite: Bife, corte graso (3 oz/85 g) / 11 g   Elija: Bife, corte magro (3 oz/85 g) / 4 g   Evite: Hamburguesa (3 oz/85 g) / 7 g   Elija:  [place] magra (3 oz/85 g) / 5 g   Evite: Jamn (3 oz/85 g) / 6 g   Elija:  Jamn magro (3 oz/85 g) / 2.4 g   Evite: Pollo, con piel (3 oz/85 g), Carne oscura / 4 g   Elija:  Pollo, sin piel (  3 oz/85 g), Carne oscura / 2 g   Evite: Pollo, con piel (3 oz/85 g), Carne magra / 2.5 g   Elija: Pollo, sin piel (3 oz/85 g), Carne magra / 1 g  Lcteos / Grasas saturadas (g)  Evite: Leche entera (1 taza) / 5 g   Elija: Leche con bajo contenido de grasa, 2% (1 taza) / 3 g   Elija: Leche con bajo contenido de grasa, 1% (1 taza) / 1.5 g   Elija: Leche descremada (1 taza) / 0.3 g   Evite: Queso duro (1 oz/28 g) / 6 g   Elija: Queso descremado (1 oz/28 g) / 2-3 g   Evite: Queso cottage, 4% grasa (1 taza)/ 6.5 g   Elija: Queso cottage con bajo contenido de grasa, 1% grasa (1 taza)/ 1.5 g   Evite: Helado (1 taza) / 9 g   Elija: Sorbete (1 taza) / 2.5 g   Elija: Yogurt helado sin contenido de  grasa (1 taza) / 0.3 g   Elija: Barras de fruta congeladas / vestigios   Evite: Crema batida (1 cucharada) / 3.5 g   Elija: Batidos glac sin lcteos (1 cucharada) / 1 g  Condimentos / Grasas saturadas (g)  Evite: Mayonesa (1 cucharada) / 2 g   Elija: Mayonesa con bajo contenido de grasa (1 cucharada) / 1 g   Evite: [place] (1 cucharada) / 7 g   Elija: Margarina extra light (1 cucharada) / 1 g   Evite: Aceite de coco (1 cucharada) / 11.8 g   Elija: Aceite de oliva (1 cucharada) / 1.8 g   Elija: Aceite de maz (1 cucharada) / 1.7 g   Elija: Aceite de crtamo (1 cucharada) / 1.2 g   Elija: Aceite de girasol (1 cucharada) / 1.4 g   Elija: Aceite de soja (1 cucharada) / 2.4 g   Elija: Aceite de canola (1 cucharada) / 1 g  Document Released: 10/10/2005 Document Revised: 06/22/2011 [employer] Patient Information 2012 [employer]. Ejercicios para perder peso (Exercise to Lose Weight) La actividad fsica y una dieta saludable ayudan a perder peso. El mdico podr sugerirle ejercicios especficos. IDEAS Y CONSEJOS PARA HACER EJERCICIOS  Elija opciones econmicas que disfrute hacer , como caminar, andar en bicicleta o los vdeos para ejercitarse.   Utilice las escaleras en lugar del ascensor.   Camine durante la hora del almuerzo.   Estacione el auto lejos del lugar de trabajo o estudio.   Concurra a un gimnasio o tome clases de gimnasia.   Comience con 5  10 minutos de actividad fsica por da. Ejercite hasta 30 minutos, 4 a 6 das por semana.   Utilice zapatos que tengan un buen soporte y ropas cmodas.   Elongue antes y despus de ejercitar.   Ejercite hasta que aumente la respiracin y el corazn palpite rpido.   Beba agua extra cuando ejercite.   No haga ejercicio hasta lastimarse, sentirse mareado o que le falte mucho el aire.  La actividad fsica puede quemar alrededor de 150 caloras.  Correr 20 cuadras en 15 minutos.   Jugar vley durante 45 a 60  minutos.   Limpiar y encerar el auto durante 45 a 60 minutos.   Jugar ftbol americano de toque.   Caminar 25 cuadras en 35 minutos.   Empujar un cochecito 20 cuadras en 30 minutos.   Jugar baloncesto durante 30 minutos.   Rastrillar hojas secas durante 30 minutos.   Andar en bicicleta 80 cuadras en 30 minutos.     Caminar 30 cuadras en 30 minutos.   Bailar durante 30 minutos.   Quitar la nieve con una pala durante 15 minutos.   Nadar vigorosamente durante 20 minutos.   Subir escaleras durante 15 minutos.   Andar en bicicleta 60 cuadras durante 15 minutos.   Arreglar el jardn entre 30 y 45 minutos.   Saltar a la soga durante 15 minutos.   Limpiar vidrios o pisos durante 45 a 60 minutos.  Document Released: 01/14/2011 Document Revised: 06/22/2011 [facility] Patient Information 2012 [place], [place].Autoexamen de [employer]  Chief Executive Officer) El autoexamen de mamas puede detectar problemas de manera temprana, prevenir complicaciones mdicas significativas y posiblemente salvar su vida. Al hacerlo, podr familiarizarse con el aspecto y forma de sus [place], y observar cambios. Esto le permite descubrir cambios de manera precoz. Este autoexamen [employer] ofrece la tranquilidad de que sus senos estn en buen [place] de [place]. Una forma de aprender qu es normal para sus mamas y si sufren modificaciones es Radio producer un autoexamen.   Si encuentra un bulto o algo que no estaba presente anteriormente, lo mejor es ponerse en contacto con su mdico inmediatamente. Otro hallazgo que debe ser evaluado por su mdico es la secrecin del pezn, especialmente si es con sangre; cambios en la piel o enrojecimiento; reas donde la piel parece estar tironeada (retrada) o nuevos bultos o protuberancias. El dolor en los senos es rara vez se asocia con el cncer (malignidad), pero tambin debe ser evaluado por un mdico.  CMO REALIZAR EL AUTOEXAMEN DE MAMAS  El mejor momento para examinar sus mamas es a los  5 a 7 das despus de finalizado el perodo menstrual. Durante la menstruacin, las mamas estn ms abultadas y puede haber ms dificultad para Clinical research associate modificaciones. Si no menstra, ha llegado a la menopausia, o le han extirpado el tero (histerectomia), usted debe examinar sus senos a intervalos regulares, por ejemplo cada mes. Si est amamantando, examine sus senos despus de alimentar al beb o despus de usar un extractor de [place]. Los implantes mamarios no disminuyen el riesgo de bultos o tumores, por lo que debe seguir realizando el autoexamen de [employer] se recomienda. Hable con su mdico acerca de cmo determinar la diferencia entre el implante y el tejido [place]. Adems, debe consultar cuanta presin debe hacer durante el examen. Con el tiempo se familiarizar con las variaciones de las mamas y se sentir ms cmoda para Horticulturist, commercial. Para el autoexamen deber quitarse toda la ropa de la cintura para [place].  1.  Observe sus senos y pezones. Prese frente a un espejo en una habitacin con buena iluminacin. Con las [employer] caderas, presione las manos firmemente [place]. Busque diferencias en la forma, el contorno y el tamao de un pecho al otro (asimetras). Entre las asimetras se incluyen arrugas, depresiones o protuberancias. Tambin, busque cambios en la piel, como reas enrojecidas o escamosas. Busque cambios en los pezones, como secreciones, hoyuelos, cambios en la posicin, o enrojecimiento. 2. [place] cuidadosamente sus senos. Es mucho mejor [employer] en la ducha o en la baera, [place] jabn o cuando est recostada sobre su espalda. Coloque el brazo (en el lado de la mama que se examina) por arriba de la cabeza. Use las yemas (no las puntas) de los tres dedos centrales de la mano opuesta para palpar. Comience en la zona de la axila, haga crculos de  de pulgada (2 cm) y vaya superponindolos. Utilice 3 niveles diferentes de presin (ligero, medio y [place]) en  cada crculo  antes de pasar al siguiente. Se necesita una presin ligera para sentir los tejidos ms cercanos a la piel. La presin media ayudar a sentir el tejido [employer] un poco ms profundo, mientras que se necesita una presin firme para palpar el tejido que se encuentra cerca de las [place]. Continuar superponiendo crculos y vaya hacia abajo, hasta sentir las [place], por debajo del [place]. Luego mueva un espacio del ancho de un dedo hacia el centro del cuerpo. Siga con los crculos del  de pulgada (2 cm) mientras va lentamente hacia la clavcula, cerca de la base del cuello. Contine con el examen hacia arriba y hacia abajo con las 3 intensidades de presin Civil Service fast streamer a la mitad del pecho. Hgalo con cada seno cuidadosamente, buscando bultos o modificaciones. 3. Debe llevar un registro escrito con los cambios o los hallazgos normales que encuentre para cada seno. Si registra esta informacin, no tiene que depender slo de la memoria para Designer, industrial/product, la sensibilidad o la ubicacin de los [place]. [place] en qu momento se encuentra del ciclo menstrual, si usted todava est menstruando. El tejido [employer] puede tener algunos bultos o tejidos engrosados. Sin embargo, consulte a su mdico si usted Animal nutritionist.   SOLICITE ATENCIN MDICA SI:   Observa cambios en la forma, en el contorno o el tamao de las mamas o los pezones.   Hay modificaciones en la piel, como zonas enrojecidas o escamosas en las mamas o en los pezones.   Tiene una secrecin anormal en los pezones.   Siente un nuevo bulto o reas engrosadas de [place] anormal.  Document Released: 10/10/2005 Document Revised: 09/26/2012 [facility] Patient Information 2014 [place], [place]. Uso de los anticonceptivos orales  (Oral Contraception Use) Los anticonceptivos orales son medicamentos que se utilizan para Location manager. Su funcin es [employer] ovarios liberen vulos. Las hormonas de los anticonceptivos  orales hacen que el moco cervical se haga ms espeso, lo que evita que el esperma ingrese al tero. Tambin hacen que la membrana que tapiza el tero se vuelva ms fina, lo que no permite que el huevo fertilizado se adhiera a la pared del tero. Los anticonceptivos orales son muy efectivos cuando se toman exactamente como se prescriben. Sin embargo, los anticonceptivos orales no previenen contra las enfermedades de transmisin sexual (ETS). La prctica del sexo seguro, como el uso de preservativos, junto con los anticonceptivos [place], [place] a prevenir ese tipo de enfermedades. Antes de tomar la pldora, usted debe hacerse un examen fsico y un [place]. El mdico podr indicarle anlisis de [place], si es necesario. El mdico se asegurar de que usted es una buena candidata para usar anticonceptivos orales. Converse con su mdico acerca de los posibles efectos secundarios de los anticonceptivos [place]. Cuando se inicia el uso de anticonceptivos [place], se pueden tomar durante 2 a 3 meses para que el cuerpo se adapte a los cambios en los niveles hormonales en el cuerpo.  CMO TOMAR LOS ANTICONCEPTIVOS ORALES  El mdico le indicar como comenzar a Surveyor, minerals ciclo de anticonceptivos orales. De lo contrario usted puede:   Psychiatric nurse. da del ciclo menstrual. No necesitar proteccin anticonceptiva adicional al Investment banker, operational.  Comenzar Financial risk analyst domingo luego de su perodo menstrual, o Medical laboratory scientific officer en que adquiere el [employer]. En estos casos deber [employer] proteccin anticonceptiva [employer] primeros 7 das del [place]. Luego de Games developer a tomar los anticonceptivos orales:   Si olvid de  tomar 1 pldora, tmela tan pronto como lo recuerde. Tome la siguiente pldora a la hora habitual.  Si olvida tomar 2  ms pldoras, utilice un mtodo anticonceptivo adicional hasta que comience su prximo perodo menstrual.  Si utiliza el envase de 28 pldoras y [place] tomar 1 de las ltimas  7 (pldoras sin hormonas), sto no tiene [place] manager. Simplemente deseche el resto de las pldoras que no contienen hormonas y comience un nuevo envase. No importa cuando comience a tomar los anticonceptivos, siempre empiece un nuevo envase el mismo da de la [place]. Tenga un envase extra de pldoras anticonceptivas y use un mtodo anticonceptivo adicional para el caso en que se olvide de tomar algunas pldoras o pierda la caja.  INSTRUCCIONES PARA EL CUIDADO DOMICILIARIO  No fume.  Siempre use un condn para protegerse de las enfermedades de transmisin sexual. Los anticonceptivos orales no protegen contra las enfermedades de transmisin sexual.  Research scientist (medical) en un calendario las fechas en las que tiene sus perodos [place].  Lea la informacin y consejos que vienen con las pldoras. Pngase en contacto con el mdico siempre que tenga preguntas. SOLICITE ATENCIN MDICA SI:  Presenta nuseas o vmitos.  Tiene flujo o sangrado vaginal anormal.  Aparece una erupcin cutnea.  No tiene el perodo menstrual.  Pierde el cabello.  Necesita tratamiento por cambios en su estado de nimo o por depresin.  Se siente mareada al tomar la pldora.  Comienza a aparecer acn con el uso de los anticonceptivos orales.  Ardelle Anton. SOLICITE ATENCIN MDICA DE INMEDIATO SI:  Siente dolor en el pecho.  Le falta el aire.  Le duele mucho la cabeza y no puede Human resources officer.  Siente adormecimiento o tiene dificultad para hablar.  Tiene problemas de visin.  Presenta dolor, inflamacin o hinchazn en las piernas. Document Released: 09/29/2011 Document Revised: 01/02/2012 [facility] Patient Information 2014 [place], [place].

## 2013-05-21 ENCOUNTER — Other Ambulatory Visit: Payer: Self-pay | Admitting: Anesthesiology

## 2013-05-21 DIAGNOSIS — E7800 Pure hypercholesterolemia, unspecified: Secondary | ICD-10-CM

## 2013-05-21 LAB — URINALYSIS W MICROSCOPIC + REFLEX CULTURE
Glucose, UA: NEGATIVE mg/dL
Hgb urine dipstick: NEGATIVE
Leukocytes, UA: NEGATIVE
Nitrite: NEGATIVE
Protein, ur: NEGATIVE mg/dL
Urobilinogen, UA: 0.2 mg/dL (ref 0.0–1.0)

## 2013-05-28 ENCOUNTER — Telehealth: Payer: Self-pay | Admitting: Anesthesiology

## 2013-05-28 NOTE — Telephone Encounter (Signed)
Please check at Ortho Tri-Cyclen this generic if so call in one month supply with 11 refills. Make sure she starts the pill on her second day of her cycle

## 2013-05-28 NOTE — Telephone Encounter (Signed)
Dr. Lily Peer, Amber Warren called requesting a change on the medication that you prescribed for her on her last office visit.. The medication its too expensive $300 and can not afford it... Please advise

## 2013-06-13 ENCOUNTER — Other Ambulatory Visit: Payer: Self-pay | Admitting: Gynecology

## 2013-06-13 MED ORDER — NORGESTIM-ETH ESTRAD TRIPHASIC 0.18/0.215/0.25 MG-35 MCG PO TABS: 1.0000 | ORAL_TABLET | Freq: Every day | ORAL | Status: DC

## 2013-06-13 NOTE — Telephone Encounter (Signed)
Amber Warren spoke with patient. Ortho tri-cylcen was e-scribed for patient by KA... Patient informed.Marland Kitchen

## 2014-01-28 ENCOUNTER — Ambulatory Visit (INDEPENDENT_AMBULATORY_CARE_PROVIDER_SITE_OTHER): Payer: Self-pay | Admitting: Gynecology

## 2014-01-28 ENCOUNTER — Ambulatory Visit (INDEPENDENT_AMBULATORY_CARE_PROVIDER_SITE_OTHER): Payer: Self-pay

## 2014-01-28 ENCOUNTER — Other Ambulatory Visit: Payer: Self-pay | Admitting: Gynecology

## 2014-01-28 ENCOUNTER — Encounter: Payer: Self-pay | Admitting: Gynecology

## 2014-01-28 VITALS — BP 124/80

## 2014-01-28 DIAGNOSIS — N949 Unspecified condition associated with female genital organs and menstrual cycle: Secondary | ICD-10-CM

## 2014-01-28 DIAGNOSIS — R102 Pelvic and perineal pain: Secondary | ICD-10-CM

## 2014-01-28 DIAGNOSIS — E282 Polycystic ovarian syndrome: Secondary | ICD-10-CM

## 2014-01-28 DIAGNOSIS — O9989 Other specified diseases and conditions complicating pregnancy, childbirth and the puerperium: Secondary | ICD-10-CM

## 2014-01-28 DIAGNOSIS — Z3490 Encounter for supervision of normal pregnancy, unspecified, unspecified trimester: Secondary | ICD-10-CM

## 2014-01-28 DIAGNOSIS — Z331 Pregnant state, incidental: Secondary | ICD-10-CM

## 2014-01-28 DIAGNOSIS — N912 Amenorrhea, unspecified: Secondary | ICD-10-CM

## 2014-01-28 DIAGNOSIS — N920 Excessive and frequent menstruation with regular cycle: Secondary | ICD-10-CM

## 2014-01-28 LAB — US OB TRANSVAGINAL

## 2014-01-28 LAB — PREGNANCY, URINE: Preg Test, Ur: POSITIVE

## 2014-01-28 NOTE — Patient Instructions (Signed)
Embarazo  (Pregnancy)  Si planea quedar embarazada, es una buena idea concertar una cita de preconcepción con el médico para poder lograr un estilo de vida saludable ante de quedar embarazada. Esto incluye dieta, peso, ejercicio, el tomar vitaminas prenatales en especial ácido fólico (ayuda a prevenir defectos en el cerebro y la médula espinal), evitar el alcohol, fumar, las drogas ilegales, problemas médicos (diabetes, convulsiones), historial familiar de problemas genéticos, condiciones de trabajo e inmunizaciones. Es mejor tener conocimiento de estas cosas y hacer algo antes de quedar embarazada.  Si está embarazada, es necesario que siga ciertas pautas para tener un bebé sano. Es muy importante realizar controles prenatales adecuados y seguir las indicaciones del profesional que la asiste. La atención prenatal incluye toda la asistencia médica que usted recibe antes del nacimiento del bebé. Esto ayuda a prevenir problemas durante el embarazo y el parto.  INSTRUCCIONES PARA EL CUIDADO DOMICILIARIO  · Comience las consultas prenatales alrededor de la 12ª semana de embarazo o lo antes posible. Al principio generalmente se programan cada mes. Se hacen más frecuentes en los 2 últimos meses antes del parto. Es importante que concurra a todas las citas con el profesional y siga sus instrucciones con respecto a los medicamentos que deba utilizar, a la actividad física y a la dieta.  · Durante el embarazo debe obtener nutrientes para usted y para su bebé. Consuma una dieta normal y bien balanceada. Elija alimentos como carne, pescado, leche y otros productos lácteos, vegetales, frutas, panes integrales y cereales El profesional le informará cuál es el aumento de peso ideal, según su peso y altura actuales. Beba gran cantidad de líquidos. Trate de beber 8 vasos de líquidos por día.  · El alcohol se asocia a cierto número de defectos del nacimiento, incluyendo el síndrome de alcoholismo fetal. Lo mejor es evitarlo  completamente El cigarrillo causa nacimientos prematuros y bebés de bajo peso al nacer. El consumo de alcohol y nicotina durante el embarazo también aumentan marcadamente la probabilidad de que el niño sea químicamente dependiente en etapas posteriores de su vida y puede contribuir al síndrome de muerte súbita infantil (SMSI)  · No consuma drogas.  · Solo tome medicamentos prescriptos o de venta libre que le haya recomendado el profesional. Algunos medicamentos pueden causar problemas genéticos y físicos al bebé  · Las náuseas matinales pueden aliviarse si come algunas galletitas saladas en la cama. Coma dos galletitas antes de levantarse por la mañana.  · Las relaciones sexuales pueden continuarse hasta casi el final del embarazo, si no se presentan otros problemas como pérdida prematura (antes de tiempo) de líquido amniótico, hemorragia vaginal, dolor durante las relaciones sexuales o dolor abdominal (en el vientre).  · Practique ejercicios con regularidad. Consulte con el profesional que la asiste si no sabe con certeza si determinados ejercicios son seguros.  · No utilice la bañera con agua caliente, baños turcos y saunas. Estos aumentan el riesgo de sufrir un desmayo o de pérdida del conocimiento, y así lastimarse usted o el bebé. La natación es un buen ejercicio. Descanse todo lo que pueda e incluya una siesta después de almorzar siempre que le sea posible, especialmente durante el tercer trimestre.  · Evite los olores y las sustancias químicas tóxicas.  · No use zapatos de tacones altos, podría perder el equilibrio y caer.  · No levante objetos de más de 2,5 kg. Si levanta un objeto, flexione las piernas y los muslos, y no la espalda.  · Evite los viajes largos, especialmente en   el tercer trimestre.  · Si debe viajar fuera de la ciudad o de su estado, lleve una copia de la historia clínica.  SOLICITE ATENCIÓN MÉDICA DE INMEDIATO SI:  · La temperatura oral se eleva sin motivo por encima de 38,9° C (102° F) o  según le indique el profesional que lo asiste.  · Tiene una pérdida de líquido por la vagina. Si sospecha una ruptura de las membranas, tómese la temperatura y llame al profesional para informarlo sobre esto.  · Observa unas pequeñas manchas o una hemorragia vaginal Notifique al profesional acerca de la cantidad y de cuántos apósitos está utilizando.  · Continúa teniendo náuseas y no obtiene alivio de los medicamentos que le han indicado, o vomita sangre o una sustancia similar a la borra del café.  · Presenta un dolor en la zona superior del abdomen.  · Siente molestias en el ligamento redondo en la parte abdominal baja. El profesional que la asiste la evaluará.  · Siente pequeñas contracciones del útero (matriz)  · No siente que el bebé se mueve, o percibe menos movimientos que antes.  · Siente dolor al orinar.  · Observa una hemorragia vaginal anormal.  · Tiene diarrea persistente.  · Sufre una cefalea grave.  · Tiene problemas visuales.  · Comienza a sentir debilidad muscular.  · Se siente mareada o sufre un desmayo.  · Comienza a sentir falta de aire.  · Siente dolor en el pecho.  · Sufre dolor en la espalda que se irradia hacia la pierna y el pie.  · Siente latidos cardíacos irregulares o la frecuencia cardíaca es muy rápida.  · Aumenta excesivamente de peso en un período breve (2,5 kg en 3 a 5 días)  · Se ve envuelta en una situación de violencia doméstica.  Document Released: 07/20/2005 Document Revised: 01/02/2012  ExitCare® Patient Information ©2014 Exit[employer], LLC.

## 2014-01-28 NOTE — Progress Notes (Signed)
   The patient is a 23 year old now gravida 1 para 0 who presented to the office today stating that her last menstrual period was on 12/01/2013. Patient with past history of oligomenorrhea/PCOS. Patient denied any nausea vomiting or any irregular bleeding. Patient states that she's never had any PID in the past. See previous encountered on 05/20/2013 for additional information on her past history.  Urine pregnancy test positive in the office  Pelvic exam: Bartholin urethra Skene was within normal limits Vagina: No lesions or discharge Cervix: No lesions or discharge uterus: 4-6 week size nontender and mobile Adnexa: No palpable mass or tenderness Rectal exam: Not done  Ultrasound:   Intrauterine pregnancy was seen in the uterus but size was less than dates. Gestational sac with a dimension of 5 mm consistent with 5 weeks and 2 days. No York sac or fetal pole was noted the cervix is long closed normal right ovary and a small 2.3 x 2.1 and left corpus luteum cyst was noted. No apparent adnexal masses were noted.  Assessment/plan:  First trimester pregnancy size less than dates. By ultrasound patient's 5 weeks and 2 days with an estimated date of confinement 09/28/2014. Patient was instructed to begin taking prenatal vitamins and she will return to the office in 3 weeks for followup ultrasound. The transformational pregnancy was provided in [place].

## 2014-01-29 ENCOUNTER — Other Ambulatory Visit: Payer: Self-pay | Admitting: Gynecology

## 2014-01-29 DIAGNOSIS — E282 Polycystic ovarian syndrome: Secondary | ICD-10-CM

## 2014-01-29 DIAGNOSIS — N912 Amenorrhea, unspecified: Secondary | ICD-10-CM

## 2014-01-29 DIAGNOSIS — N920 Excessive and frequent menstruation with regular cycle: Secondary | ICD-10-CM

## 2014-01-29 DIAGNOSIS — O9989 Other specified diseases and conditions complicating pregnancy, childbirth and the puerperium: Secondary | ICD-10-CM

## 2014-02-19 ENCOUNTER — Other Ambulatory Visit: Payer: Self-pay | Admitting: Gynecology

## 2014-02-19 ENCOUNTER — Encounter: Payer: Self-pay | Admitting: Gynecology

## 2014-02-19 ENCOUNTER — Ambulatory Visit (INDEPENDENT_AMBULATORY_CARE_PROVIDER_SITE_OTHER): Payer: Self-pay

## 2014-02-19 ENCOUNTER — Ambulatory Visit (INDEPENDENT_AMBULATORY_CARE_PROVIDER_SITE_OTHER): Payer: Self-pay | Admitting: Gynecology

## 2014-02-19 VITALS — BP 128/84

## 2014-02-19 DIAGNOSIS — O9989 Other specified diseases and conditions complicating pregnancy, childbirth and the puerperium: Secondary | ICD-10-CM

## 2014-02-19 DIAGNOSIS — O418X10 Other specified disorders of amniotic fluid and membranes, first trimester, not applicable or unspecified: Secondary | ICD-10-CM

## 2014-02-19 DIAGNOSIS — E282 Polycystic ovarian syndrome: Secondary | ICD-10-CM

## 2014-02-19 DIAGNOSIS — O468X1 Other antepartum hemorrhage, first trimester: Secondary | ICD-10-CM

## 2014-02-19 DIAGNOSIS — O43899 Other placental disorders, unspecified trimester: Secondary | ICD-10-CM

## 2014-02-19 DIAGNOSIS — N831 Corpus luteum cyst of ovary, unspecified side: Secondary | ICD-10-CM

## 2014-02-19 DIAGNOSIS — Z331 Pregnant state, incidental: Secondary | ICD-10-CM

## 2014-02-19 DIAGNOSIS — N912 Amenorrhea, unspecified: Secondary | ICD-10-CM

## 2014-02-19 DIAGNOSIS — O0993 Supervision of high risk pregnancy, unspecified, third trimester: Secondary | ICD-10-CM | POA: Insufficient documentation

## 2014-02-19 DIAGNOSIS — Z3490 Encounter for supervision of normal pregnancy, unspecified, unspecified trimester: Secondary | ICD-10-CM

## 2014-02-19 LAB — US OB TRANSVAGINAL

## 2014-02-19 NOTE — Patient Instructions (Signed)
Embarazo  (Pregnancy)  Si planea quedar embarazada, es una buena idea concertar una cita de preconcepción con el médico para poder lograr un estilo de vida saludable ante de quedar embarazada. Esto incluye dieta, peso, ejercicio, el tomar vitaminas prenatales en especial ácido fólico (ayuda a prevenir defectos en el cerebro y la médula espinal), evitar el alcohol, fumar, las drogas ilegales, problemas médicos (diabetes, convulsiones), historial familiar de problemas genéticos, condiciones de trabajo e inmunizaciones. Es mejor tener conocimiento de estas cosas y hacer algo antes de quedar embarazada.  Si está embarazada, es necesario que siga ciertas pautas para tener un bebé sano. Es muy importante realizar controles prenatales adecuados y seguir las indicaciones del profesional que la asiste. La atención prenatal incluye toda la asistencia médica que usted recibe antes del nacimiento del bebé. Esto ayuda a prevenir problemas durante el embarazo y el parto.  INSTRUCCIONES PARA EL CUIDADO DOMICILIARIO  · Comience las consultas prenatales alrededor de la 12ª semana de embarazo o lo antes posible. Al principio generalmente se programan cada mes. Se hacen más frecuentes en los 2 últimos meses antes del parto. Es importante que concurra a todas las citas con el profesional y siga sus instrucciones con respecto a los medicamentos que deba utilizar, a la actividad física y a la dieta.  · Durante el embarazo debe obtener nutrientes para usted y para su bebé. Consuma una dieta normal y bien balanceada. Elija alimentos como carne, pescado, leche y otros productos lácteos, vegetales, frutas, panes integrales y cereales El profesional le informará cuál es el aumento de peso ideal, según su peso y altura actuales. Beba gran cantidad de líquidos. Trate de beber 8 vasos de líquidos por día.  · El alcohol se asocia a cierto número de defectos del nacimiento, incluyendo el síndrome de alcoholismo fetal. Lo mejor es evitarlo  completamente El cigarrillo causa nacimientos prematuros y bebés de bajo peso al nacer. El consumo de alcohol y nicotina durante el embarazo también aumentan marcadamente la probabilidad de que el niño sea químicamente dependiente en etapas posteriores de su vida y puede contribuir al síndrome de muerte súbita infantil (SMSI)  · No consuma drogas.  · Solo tome medicamentos prescriptos o de venta libre que le haya recomendado el profesional. Algunos medicamentos pueden causar problemas genéticos y físicos al bebé  · Las náuseas matinales pueden aliviarse si come algunas galletitas saladas en la cama. Coma dos galletitas antes de levantarse por la mañana.  · Las relaciones sexuales pueden continuarse hasta casi el final del embarazo, si no se presentan otros problemas como pérdida prematura (antes de tiempo) de líquido amniótico, hemorragia vaginal, dolor durante las relaciones sexuales o dolor abdominal (en el vientre).  · Practique ejercicios con regularidad. Consulte con el profesional que la asiste si no sabe con certeza si determinados ejercicios son seguros.  · No utilice la bañera con agua caliente, baños turcos y saunas. Estos aumentan el riesgo de sufrir un desmayo o de pérdida del conocimiento, y así lastimarse usted o el bebé. La natación es un buen ejercicio. Descanse todo lo que pueda e incluya una siesta después de almorzar siempre que le sea posible, especialmente durante el tercer trimestre.  · Evite los olores y las sustancias químicas tóxicas.  · No use zapatos de tacones altos, podría perder el equilibrio y caer.  · No levante objetos de más de 2,5 kg. Si levanta un objeto, flexione las piernas y los muslos, y no la espalda.  · Evite los viajes largos, especialmente en   el tercer trimestre.  · Si debe viajar fuera de la ciudad o de su estado, lleve una copia de la historia clínica.  SOLICITE ATENCIÓN MÉDICA DE INMEDIATO SI:  · La temperatura oral se eleva sin motivo por encima de 38,9° C (102° F) o  según le indique el profesional que lo asiste.  · Tiene una pérdida de líquido por la vagina. Si sospecha una ruptura de las membranas, tómese la temperatura y llame al profesional para informarlo sobre esto.  · Observa unas pequeñas manchas o una hemorragia vaginal Notifique al profesional acerca de la cantidad y de cuántos apósitos está utilizando.  · Continúa teniendo náuseas y no obtiene alivio de los medicamentos que le han indicado, o vomita sangre o una sustancia similar a la borra del café.  · Presenta un dolor en la zona superior del abdomen.  · Siente molestias en el ligamento redondo en la parte abdominal baja. El profesional que la asiste la evaluará.  · Siente pequeñas contracciones del útero (matriz)  · No siente que el bebé se mueve, o percibe menos movimientos que antes.  · Siente dolor al orinar.  · Observa una hemorragia vaginal anormal.  · Tiene diarrea persistente.  · Sufre una cefalea grave.  · Tiene problemas visuales.  · Comienza a sentir debilidad muscular.  · Se siente mareada o sufre un desmayo.  · Comienza a sentir falta de aire.  · Siente dolor en el pecho.  · Sufre dolor en la espalda que se irradia hacia la pierna y el pie.  · Siente latidos cardíacos irregulares o la frecuencia cardíaca es muy rápida.  · Aumenta excesivamente de peso en un período breve (2,5 kg en 3 a 5 días)  · Se ve envuelta en una situación de violencia doméstica.  Document Released: 07/20/2005 Document Revised: 01/02/2012  ExitCare® Patient Information ©2014 Exit[employer], LLC.

## 2014-02-19 NOTE — Progress Notes (Signed)
   The patient presented to the office today for followup ultrasound. She was seen in the office on 01/28/2014 where she was laid on her menses and stated that it was on 12/01/2013. Patient with known history of oligomenorrhea/PCOS. She had a positive pregnancy test at that office visit and had an ultrasound which demonstrated the following:  Intrauterine pregnancy was seen in the uterus but size was less than dates. Gestational sac with a dimension of 5 mm consistent with 5 weeks and 2 days. No York sac or fetal pole was noted the cervix is long closed normal right ovary and a small 2.3 x 2.1 and left corpus luteum cyst was noted. No apparent adnexal masses were noted.  She was asked to return back to the office for follow ultrasound for fetal viability. Today's ultrasound demonstrated that her size less than dates. By ultrasound she 7 weeks and 2 days with an estimated date of confinement 10/06/2014. A viable intrauterine pregnancy was seen with fetal pole and cardiac activity present. Crown-rump length measurement consistent with 7 weeks and 2 days. Amnion and yolk sac is seen. Small left corpus luteum cyst seen right ovary normal. Cervix long closed. A small anterior subchorionic hematoma measuring 16 x 6 x 12 mm was noted.  Patient denies any vaginal bleeding is otherwise doing well taking her prenatal vitamins.  Assessment/plan: First trimester pregnancy size less than dates. Currently 7 weeks and 2 days with due date 10/06/2014. Small anterior subchorionic hematoma was noted. Precautions will be taken such as abstaining from intercourse or strenuous activity until further notice. Patient [facility] hospital. She will continue her prenatal vitamins that was recommended. All the instructions were provided in Spanish and all questions were answered.

## 2014-04-07 ENCOUNTER — Other Ambulatory Visit (HOSPITAL_COMMUNITY): Payer: Self-pay | Admitting: Physician Assistant

## 2014-04-07 DIAGNOSIS — Z3689 Encounter for other specified antenatal screening: Secondary | ICD-10-CM

## 2014-04-07 LAB — OB RESULTS CONSOLE HEPATITIS B SURFACE ANTIGEN: Hepatitis B Surface Ag: NEGATIVE

## 2014-04-07 LAB — OB RESULTS CONSOLE RPR: RPR: NONREACTIVE

## 2014-04-07 LAB — OB RESULTS CONSOLE ANTIBODY SCREEN: Antibody Screen: NEGATIVE

## 2014-04-07 LAB — OB RESULTS CONSOLE ABO/RH: RH TYPE: POSITIVE

## 2014-04-07 LAB — OB RESULTS CONSOLE GC/CHLAMYDIA
CHLAMYDIA, DNA PROBE: NEGATIVE
GC PROBE AMP, GENITAL: NEGATIVE

## 2014-04-07 LAB — OB RESULTS CONSOLE RUBELLA ANTIBODY, IGM: Rubella: IMMUNE

## 2014-04-07 LAB — OB RESULTS CONSOLE HIV ANTIBODY (ROUTINE TESTING): HIV: NONREACTIVE

## 2014-04-17 ENCOUNTER — Ambulatory Visit (HOSPITAL_COMMUNITY)
Admission: RE | Admit: 2014-04-17 | Discharge: 2014-04-17 | Disposition: A | Discharge status: Home / Self Care | Payer: Self-pay | Source: Ambulatory Visit | Attending: Physician Assistant | Admitting: Physician Assistant

## 2014-04-17 DIAGNOSIS — Z3689 Encounter for other specified antenatal screening: Secondary | ICD-10-CM | POA: Insufficient documentation

## 2014-05-05 ENCOUNTER — Other Ambulatory Visit (HOSPITAL_COMMUNITY): Payer: Self-pay | Admitting: Nurse Practitioner

## 2014-05-05 DIAGNOSIS — Z3689 Encounter for other specified antenatal screening: Secondary | ICD-10-CM

## 2014-05-07 ENCOUNTER — Ambulatory Visit (HOSPITAL_COMMUNITY)
Admission: RE | Admit: 2014-05-07 | Discharge: 2014-05-07 | Disposition: A | Discharge status: Home / Self Care | Payer: Self-pay | Source: Ambulatory Visit | Attending: Nurse Practitioner | Admitting: Nurse Practitioner

## 2014-05-07 ENCOUNTER — Other Ambulatory Visit (HOSPITAL_COMMUNITY): Payer: Self-pay | Admitting: Nurse Practitioner

## 2014-05-07 DIAGNOSIS — Z3689 Encounter for other specified antenatal screening: Secondary | ICD-10-CM | POA: Insufficient documentation

## 2014-08-25 ENCOUNTER — Encounter: Payer: Self-pay | Admitting: Gynecology

## 2014-09-04 LAB — OB RESULTS CONSOLE GBS: STREP GROUP B AG: NEGATIVE

## 2014-09-29 ENCOUNTER — Encounter (HOSPITAL_COMMUNITY): Payer: Self-pay | Admitting: *Deleted

## 2014-09-29 ENCOUNTER — Inpatient Hospital Stay (HOSPITAL_COMMUNITY)
Admission: AD | Admit: 2014-09-29 | Discharge: 2014-09-29 | Disposition: A | Discharge status: Home / Self Care | Payer: Self-pay | Source: Ambulatory Visit | Attending: Family Medicine | Admitting: Family Medicine

## 2014-09-29 DIAGNOSIS — Z3A40 40 weeks gestation of pregnancy: Secondary | ICD-10-CM | POA: Insufficient documentation

## 2014-09-29 DIAGNOSIS — O471 False labor at or after 37 completed weeks of gestation: Secondary | ICD-10-CM | POA: Insufficient documentation

## 2014-09-29 HISTORY — DX: Anemia, unspecified: D64.9

## 2014-09-29 MED ORDER — ZOLPIDEM TARTRATE ER 6.25 MG PO TBCR: 6.2500 mg | EXTENDED_RELEASE_TABLET | Freq: Every evening | ORAL | Status: DC | PRN

## 2014-09-29 NOTE — MAU Note (Signed)
Pt reports contractions since yesterday. Worsening today. Denies problems with pregnancy. Reports good fetal movement.

## 2014-09-29 NOTE — Discharge Instructions (Signed)
Contracciones de Braxton Hicks °(Braxton Hicks Contractions) °Durante el embarazo, pueden presentarse contracciones uterinas que no siempre indican que está en trabajo de parto.  °¿QUÉ SON LAS CONTRACCIONES DE BRAXTON HICKS?  °Las contracciones que se presentan antes del trabajo de parto se conocen como contracciones de Braxton Hicks o falso trabajo de parto. Hacia el final del embarazo (32 a 34 semanas), estas contracciones pueden aparecen con más frecuencia y volverse más intensas. No corresponden al trabajo de parto verdadero porque estas contracciones no producen el agrandamiento (la dilatación) y el afinamiento del cuello del útero. Algunas veces, es difícil distinguirlas del trabajo de parto verdadero porque en algunos casos pueden ser muy intensas, y las personas tienen diferentes niveles de tolerancia al dolor. No debe sentirse avergonzada si concurre al hospital con falso trabajo de parto. En ocasiones, la única forma de saber si el trabajo de parto es verdadero es que el médico determine si hay cambios en el cuello del útero. °Si no hay problemas prenatales u otras complicaciones de salud asociadas con el embarazo, no habrá inconvenientes si la envían a su casa con falso trabajo de parto y espera que comience el verdadero. °CÓMO DIFERENCIAR EL TRABAJO DE PARTO FALSO DEL VERDADERO °Falso trabajo de parto °· Las contracciones del falso trabajo de parto duran menos y no son tan intensas como las verdaderas. °· Generalmente son irregulares. °· A menudo, se sienten en la parte delantera de la parte baja del abdomen y en la ingle, °· y pueden desaparecer cuando camina o cambia de posición mientras está acostada. °· Las contracciones se vuelven más débiles y su duración es menor a medida que el tiempo transcurre. °· Por lo general, no se hacen progresivamente más intensas, regulares y cercanas entre sí como en el caso del trabajo de parto verdadero. °Verdadero trabajo de parto °· Las contracciones del verdadero  trabajo de parto duran de 30 a 70 segundos, son muy regulares y suelen volverse más intensas, y aumenta su frecuencia. °· No desaparecen cuando camina. °· La molestia generalmente se siente en la parte superior del útero y se extiende hacia la zona inferior del abdomen y hacia la cintura. °· El médico podrá examinarla para determinar si el trabajo de parto es verdadero. El examen mostrará si el cuello del útero se está dilatando y afinando. °LO QUE DEBE RECORDAR °· Continúe haciendo los ejercicios habituales y siga otras indicaciones que el médico le dé. °· Tome todos los medicamentos como le indicó el médico. °· Concurra a las visitas prenatales regulares. °· Coma y beba con moderación si cree que está en trabajo de parto. °· Si las contracciones de Braxton Hicks le provocan incomodidad: °¨ Cambie de posición: si está acostada o descansando, camine; si está caminando, descanse. °¨ Siéntese y descanse en una bañera con agua tibia. °¨ Beba 2 o 3 vasos de agua. La deshidratación puede provocar contracciones. °¨ Respire lenta y profundamente varias veces por hora. °¿CUÁNDO DEBO BUSCAR ASISTENCIA MÉDICA INMEDIATA? °Solicite atención médica de inmediato si: °· Las contracciones se intensifican, se hacen más regulares y cercanas entre sí. °· Tiene una pérdida de líquido por la vagina. °· Tiene fiebre. °· Elimina mucosidad manchada con sangre. °· Tiene una hemorragia vaginal abundante. °· Tiene dolor abdominal permanente. °· Tiene un dolor en la zona lumbar que nunca tuvo antes. °· Siente que la cabeza del bebé empuja hacia abajo y ejerce presión en la zona pélvica. °· El bebé no se mueve tanto como solía. °Document Released: 07/20/2005 Document Revised: 10/15/2013 °ExitCare® Patient   Information 2015 [place]Care, [place]. This information is not intended to replace advice given to you by your health care provider. Make sure you discuss any questions you have with your health care provider. Parto vaginal (Vaginal  Delivery) Durante el parto, el mdico la ayudar a dar a luz a su beb. En elparto vaginal, deber pujar para que el beb salga por la vagina. Sin embargo, antes de que pueda sacar al beb, es necesario que ocurran ciertas cosas. La abertura del tero (cuello del tero) tiene que ablandarse, hacerse ms delgado y abrirse (dilatar) hasta que llegue a 10 cm. Adems, el beb tiene que bajar desde el tero a la vagina. SIGNOS DE TRABAJO DE PARTO  El mdico tendr primero que asegurarse de que usted est en Donnellytrabajo de parto. Algunos signos son:   Eliminar lo que se llama tapn mucoso antes del inicio del trabajo de [place]. Este es una pequea cantidad de mucosidad teida con sangre.  Tener contracciones uterinas regulares y dolorosas.   El Bank of Americatiempo entre las contracciones debe acortarse  Las molestias y Chief Technology Officerel dolor se harn ms intensos gradualmente.  El dolor de las contracciones empeora al caminar y no se alivia con el reposo.   El cuello del tero se hace mas delgado (se borra) y se dilata. ANTES DEL PARTO Una vez que se inicie el trabajo de parto y sea admitida en el hospital o sanatorio, el mdico podr hacer lo siguiente:   Education officer, environmentalealizar un examen fsico.  Controlar si hay complicaciones relacionadas con Kathie Dikeel trabajo de parto.  Verificar su presin arterial, temperatura y pulso y la frecuencia cardaca (signos vitales).   Determinar si se ha roto el saco amnitico y cundo ha ocurrido.  Realizar un examen vaginal (utilizando un guante estril y un lubricante) para determinar:  La posicin (presentacin) del beb. El beb se presenta con la cabeza primero (vertex) en el canal de parto (vagina), o estn los pies o las nalgas primero (de nalgas)?  El nivel (estacin) de la cabeza del beb dentro del canal de parto.  El borramiento y la dilatacin del cuello uterino  El monitor fetal electrnico generalmente se coloca sobre el abdomen al Environmental health practitionerllegar. Se utiliza para controlar las contracciones y  la frecuencia cardaca del beb.  Cuando el monitor est en el abdomen (monitor fetal externo), slo toma la frecuencia y la duracin de las contracciones. No informa acerca de la intensidad de las contracciones.  Si el mdico necesita saber exactamente la intensidad de las contracciones o cul es la frecuencia cardaca del beb, colocar un monitor interno en la vagina y [place]el tero. El mdico Liz Claibornecomentar los riesgos y los beneficios de usar un monitor interno y le pedir autorizacin antes de Scientist, product/process developmentcolocar el dispositivo.  El monitoreo fetal continuo ser necesario si le han aplicado una epidural, si le administran ciertos medicamentos (como oxitocina) y si tiene complicaciones del [place] Colonyembarazo o del trabajo de [place]parto.  Podrn colocarle una va intravenosa en una vena del brazo para suministrarle lquidos y medicamentos, si es necesario. TRES ETAPAS DEL TRABAJO DE PARTO Y EL PARTO El Martinsvilletrabajo de parto y el parto normales se dividen en tres etapas. Primera etapa Esta etapa comienza cuando comienzan las contracciones regulares y el cuello comienza a borrarse y dilatarse. Finaliza cuando el cuello est completamente abierto (completamente dilatado). La primera etapa es la etapa ms larga del Rossvilletrabajo de parto y puede durar desde 3 horas a 15 horas.  Algunos mtodos estn disponibles para ayudar con el dolor del [place]oparto.  Usted y su mdico decidirn qu opcin es la mejor para usted. Las opciones incluyen:   Medicamentos narcticos. Estos son medicamentos fuertes que usted puede recibir a Games developertravs de una va intravenosa o como inyeccin en el msculo. Estos medicamentos Associate Professoralivian el dolor pero no hacen que desaparezca completamente.  Epidural. Se administra un medicamento a travs de un tubo delgado que se inserta en la espalda. El medicamento adormece la parte inferior del cuerpo y evita el dolor en esa zona.  Bloqueo paracervical Es una inyeccin de un anestsico en cada lado del cuello uterino.  Usted podr pedir un  parto natural, que implica que no se usen analgsicos ni epidural durante el parto y [place] trabajo de [place]parto. En cambio, podr tener otro tipo de ayuda como ejercicios respiratorios para hacer frente al [employer]. Segunda etapa La segunda etapa del trabajo de parto comienza cuando el cuello se ha dilatado completamente a 10 cm. Contina hasta que usted puja al beb hacia abajo, por el canal de Abieparto, y el beb nace. Esta etapa puede durar slo algunos minutos o algunas horas.  La posicin del la Turkmenistancabeza del beb a medida que pasa por el canal de parto, es informada como un nmero, llamado estacin. Si la cabeza del beb no ha iniciado su descenso, la estacin se describe como que est en menos 3 (-3). Cuando la cabeza del beb est en la estacin cero, est en el medio del canal de parto y se encaja en la pelvis. La estacin en la que se encuentra el beb indica el progreso de la segunda etapa del Gliddentrabajo de [place]parto.  Cuando el beb nace, el mdico lo sostendr con la cabeza hacia abajo para evitar que el lquido amnitico, el moco y la sangre entren en los pulmones del beb. La boca y la nariz del beb podrn ser succionadas con un pequeo bulbo para retirar todo lquido adicional.  El mdico podr Scientific laboratory techniciancolocar al beb sobre su estmago. Es importante evitar que el beb tome fro. Para hacerlo, el mdico secar al beb, lo colocar directamente sobre su piel, (sin mantas entre usted y el beb) y lo cubrir con mantas secas y tibias.  Se corta el cordn umbilical. Tercera etapa Durante la tercera etapa del trabajo de parto, el mdico sacar la placenta (alumbramiento) y se asegurar de que el sangrado est controlado. La salida de la placenta generalmente demora 5 minutos pero puede tardar hasta 30 minutos. Luego de la salida de la placenta, le darn un medicamento por va intravenosa o inyectable para ayudar a Engineer, manufacturingcontraer el tero y Air traffic controllercontrolar el sangrado. Si planea amamantar al beb, puede intentar en este momento Luego  de la salida de la placenta, el tero debe contraerse y Kenesawquedar muy firme. Si el tero no queda firme, el mdico lo Engineer, maintenance (IT)masajear. Esto es importante debido a que la contraccin del tero ayuda a Location managercortar el sangrado en el sitio en que la placenta estaba unida al tero. Si el tero no se contrae adecuadamente ni Terex Corporationpermanece firme, podr causar un sangrado abundante. Si hay mucho sangrado, podrn darle medicamentos para contraer el tero y Therapist, musicdetener el sangrado.  Document Released: 09/22/2008 Document Revised: 02/24/2014 [facility] IncExitCare Patient Information 2015 [place]ExitCare, [place]LLC. This information is not intended to replace advice given to you by your health care provider. Make sure you discuss any questions you have with your health care provider.

## 2014-09-29 NOTE — MAU Provider Note (Signed)
Patient here for RN labor check.  Cervix fingertip.  Patient uncomfortable with irregular contractions.  Will prescribe ambien x5 tablets.  Amber LatchAngela Bacigalupo, MD, MPH PGY-1,  [facility] 09/29/2014 9:34 PM    OB fellow attestation: I agree with above documentation in the resident's note.   Amber Warren is a 23 y.o. G1P0 here for nurse labor check.  +FM, denies LOF, VB, contractions, vaginal discharge.  PE: BP 118/67 mmHg  Pulse 70  Temp(Src) 99 F (37.2 C) (Oral)  Resp 18  Ht 4' 10.5" (1.486 m)  Wt 185 lb (83.915 kg)  BMI 38.00 kg/m2  SpO2 100%  LMP 12/01/2013 Nurse SVE FT/Th/Hi  ROS, labs, PMH reviewed NST reviewed and reactive   Plan: Early Latent Labor - Discussed plan with resident and nursing.  - Not in active labor. - Plan to discharge to home with labor precautions.  - Rx Ambien for therapeutic rest.   Amber Warren, Amber Lazaro, MD 9:43 PM

## 2014-09-30 ENCOUNTER — Inpatient Hospital Stay (HOSPITAL_COMMUNITY)
Admission: AD | Admit: 2014-09-30 | Discharge: 2014-09-30 | Disposition: A | Discharge status: Home / Self Care | Payer: Self-pay | Source: Ambulatory Visit | Attending: Obstetrics and Gynecology | Admitting: Obstetrics and Gynecology

## 2014-09-30 ENCOUNTER — Encounter (HOSPITAL_COMMUNITY): Payer: Self-pay | Admitting: *Deleted

## 2014-09-30 ENCOUNTER — Encounter (HOSPITAL_COMMUNITY): Payer: Self-pay

## 2014-09-30 ENCOUNTER — Inpatient Hospital Stay (HOSPITAL_COMMUNITY): Payer: Self-pay

## 2014-09-30 DIAGNOSIS — IMO0002 Reserved for concepts with insufficient information to code with codable children: Secondary | ICD-10-CM

## 2014-09-30 NOTE — Discharge Instructions (Signed)
Tercer trimestre de embarazo (Third Trimester of Pregnancy) El tercer trimestre va desde la semana29 hasta la 42, desde el sptimo hasta el noveno mes, y es la poca en la que el feto crece ms rpidamente. Hacia el final del noveno mes, el feto mide alrededor de 20pulgadas (45cm) de largo y pesa entre 6 y 10 libras (2,700 y 4,500kg).  CAMBIOS EN EL ORGANISMO Su organismo atraviesa por muchos cambios durante el embarazo, y estos varan de una mujer a otra.   Seguir aumentando de peso. Es de esperar que aumente entre 25 y 35libras (11 y 16kg) hacia el final del embarazo.  Podrn aparecer las primeras estras en las caderas, el abdomen y las mamas.  Puede tener necesidad de orinar con ms frecuencia porque el feto baja hacia la pelvis y ejerce presin sobre la vejiga.  Debido al embarazo podr sentir acidez estomacal con frecuencia.  Puede estar estreida, ya que ciertas hormonas enlentecen los movimientos de los msculos que empujan los desechos a travs de los intestinos.  Pueden aparecer hemorroides o abultarse e hincharse las venas (venas varicosas).  Puede sentir dolor plvico debido al aumento de peso y a que las hormonas del embarazo relajan las articulaciones entre los huesos de la pelvis. El dolor de espalda puede ser consecuencia de la sobrecarga de los msculos que soportan la postura.  Tal vez haya cambios en el cabello que pueden incluir su engrosamiento, crecimiento rpido y cambios en la textura. Adems, a algunas mujeres se les cae el cabello durante o despus del embarazo, o tienen el cabello seco o fino. Lo ms probable es que el cabello se le normalice despus del nacimiento del beb.  Las mamas seguirn creciendo y le dolern. A veces, puede haber una secrecin amarilla de las mamas llamada calostro.  El ombligo puede salir hacia afuera.  Puede sentir que le falta el aire debido a que se expande el tero.  Puede notar que el feto "baja" o lo siente ms bajo, en el  abdomen.  Puede tener una prdida de secrecin mucosa con sangre. Esto suele ocurrir en el trmino de unos pocos das a una semana antes de que comience el trabajo de parto.  El cuello del tero se vuelve delgado y blando (se borra) cerca de la fecha de parto. QU DEBE ESPERAR EN LOS EXMENES PRENATALES  Le harn exmenes prenatales cada 2semanas hasta la semana36. A partir de ese momento le harn exmenes semanales. Durante una visita prenatal de rutina:  La pesarn para asegurarse de que usted y el feto estn creciendo normalmente.  Le tomarn la presin arterial.  Le medirn el abdomen para controlar el desarrollo del beb.  Se escucharn los latidos cardacos fetales.  Se evaluarn los resultados de los estudios solicitados en visitas anteriores.  Le revisarn el cuello del tero cuando est prxima la fecha de parto para controlar si este se ha borrado. Alrededor de la semana36, el mdico le revisar el cuello del tero. Al mismo tiempo, realizar un anlisis de las secreciones del tejido vaginal. Este examen es para determinar si hay un tipo de bacteria, estreptococo Grupo B. El mdico le explicar esto con ms detalle. El mdico puede preguntarle lo siguiente:  Cmo le gustara que fuera el parto.  Cmo se siente.  Si siente los movimientos del beb.  Si ha tenido sntomas anormales, como prdida de lquido, sangrado, dolores de cabeza intensos o clicos abdominales.  Si tiene alguna pregunta. Otros exmenes o estudios de deteccin que pueden realizarse   durante el tercer trimestre incluyen lo siguiente:  Anlisis de sangre para controlar las concentraciones de hierro (anemia).  Controles fetales para determinar su salud, nivel de actividad y crecimiento. Si tiene alguna enfermedad o hay problemas durante el embarazo, le harn estudios. FALSO TRABAJO DE PARTO Es posible que sienta contracciones leves e irregulares que finalmente desaparecen. Se llaman contracciones de  Braxton Hicks o falso trabajo de parto. Las contracciones pueden durar horas, das o incluso semanas, antes de que el verdadero trabajo de parto se inicie. Si las contracciones ocurren a intervalos regulares, se intensifican o se hacen dolorosas, lo mejor es que la revise el mdico.  SIGNOS DE TRABAJO DE PARTO   Clicos de tipo menstrual.  Contracciones cada 5minutos o menos.  Contracciones que comienzan en la parte superior del tero y se extienden hacia abajo, a la zona inferior del abdomen y la espalda.  Sensacin de mayor presin en la pelvis o dolor de espalda.  Una secrecin de mucosidad acuosa o con sangre que sale de la vagina. Si tiene alguno de estos signos antes de la semana37 del embarazo, llame a su mdico de inmediato. Debe concurrir al hospital para que la controlen inmediatamente. INSTRUCCIONES PARA EL CUIDADO EN EL HOGAR   Evite fumar, consumir hierbas, beber alcohol y tomar frmacos que no le hayan recetado. Estas sustancias qumicas afectan la formacin y el desarrollo del beb.  Siga las indicaciones del mdico en relacin con el uso de medicamentos. Durante el embarazo, hay medicamentos que son seguros de tomar y otros que no.  Haga actividad fsica solo en la forma indicada por el mdico. Sentir clicos uterinos es un buen signo para detener la actividad fsica.  Contine comiendo alimentos que sanos con regularidad.  Use un sostn que le brinde buen soporte si le duelen las mamas.  No se d baos de inmersin en agua caliente, baos turcos ni saunas.  Colquese el cinturn de seguridad cuando conduzca.  No coma carne cruda ni queso sin cocinar; evite el contacto con las bandejas sanitarias de los gatos y la tierra que estos animales usan. Estos elementos contienen grmenes que pueden causar defectos congnitos en el beb.  Tome las vitaminas prenatales.  Si est estreida, pruebe un laxante suave (si el mdico lo autoriza). Consuma ms alimentos ricos en  fibra, como vegetales y frutas frescos y cereales integrales. Beba gran cantidad de lquido para mantener la orina de tono claro o color amarillo plido.  Dese baos de asiento con agua tibia para aliviar el dolor o las molestias causadas por las hemorroides. Use una crema para las hemorroides si el mdico la autoriza.  Si tiene venas varicosas, use medias de descanso. Eleve los pies durante 15minutos, 3 o 4veces por da. Limite la cantidad de sal en su dieta.  Evite levantar objetos pesados, use zapatos de tacones bajos y mantenga una buena postura.  Descanse con las piernas elevadas si tiene calambres o dolor de cintura.  Visite a su dentista si no lo ha hecho durante el embarazo. Use un cepillo de dientes blando para higienizarse los dientes y psese el hilo dental con suavidad.  Puede seguir manteniendo relaciones sexuales, a menos que el mdico le indique lo contrario.  No haga viajes largos excepto que sea absolutamente necesario y solo con la autorizacin del mdico.  Tome clases prenatales para entender, practicar y hacer preguntas sobre el trabajo de parto y el parto.  Haga un ensayo de la partida al hospital.  Prepare el bolso que   llevar al hospital.  Prepare la habitacin del beb.  Concurra a todas las visitas prenatales segn las indicaciones de su mdico. SOLICITE ATENCIN MDICA SI:  No est segura de que est en trabajo de parto o de que ha roto la bolsa de las aguas.  Tiene mareos.  Siente clicos leves, presin en la pelvis o dolor persistente en el abdomen.  Tiene nuseas, vmitos o diarrea persistentes.  Tiene secrecin vaginal con mal olor.  Siente dolor al orinar. SOLICITE ATENCIN MDICA DE INMEDIATO SI:   Tiene fiebre.  Tiene una prdida de lquido por la vagina.  Tiene sangrado o pequeas prdidas vaginales.  Siente dolor intenso o clicos en el abdomen.  Sube o baja de peso rpidamente.  Tiene dificultad para respirar y siente dolor de  pecho.  Sbitamente se le hinchan mucho el rostro, las manos, los tobillos, los pies o las piernas.  No ha sentido los movimientos del beb durante una hora.  Siente un dolor de cabeza intenso que no se alivia con medicamentos.  Hay cambios en la visin. Document Released: 07/20/2005 Document Revised: 10/15/2013 [employer] Patient Information 2015 [employer]. This information is not intended to replace advice given to you by your health care provider. Make sure you discuss any questions you have with your health care provider.  

## 2014-09-30 NOTE — MAU Provider Note (Signed)
G1P0 at 548w0d seen for RN labor eval.  FHR initially non-reactive, BPP ordered.  BPP canceled after patient up to bathroom and FHR reactive with many accels.  Cervix 1/40/high, unable to determine presenting part on SVE.  Personally performed bedside US and determined vertex position.    A/P: Early latent labor in primigravida  - Discharge home - keep f/u appt at HD on 12/10 - kick counts and labor precautions given - continue ambien qhs prn for therapeutic rest   Shirlee LatchAngela Bacigalupo, MD, MPH PGY-1,  [facility] 09/30/2014 10:56 PM

## 2014-09-30 NOTE — MAU Note (Signed)
Pt states she has been having contractions for 2 days.pt was given a prescription for Ambien and took 1 last night and 1 this morning

## 2014-10-01 ENCOUNTER — Inpatient Hospital Stay (HOSPITAL_COMMUNITY)
Admission: AD | Admit: 2014-10-01 | Discharge: 2014-10-04 | DRG: 766 | Disposition: A | Discharge status: Home / Self Care | Payer: Medicaid Other | Source: Ambulatory Visit | Attending: Obstetrics & Gynecology | Admitting: Obstetrics & Gynecology

## 2014-10-01 ENCOUNTER — Inpatient Hospital Stay (HOSPITAL_COMMUNITY): Payer: Medicaid Other | Admitting: Anesthesiology

## 2014-10-01 ENCOUNTER — Encounter (HOSPITAL_COMMUNITY): Payer: Self-pay | Admitting: General Practice

## 2014-10-01 DIAGNOSIS — O99214 Obesity complicating childbirth: Secondary | ICD-10-CM | POA: Diagnosis present

## 2014-10-01 DIAGNOSIS — Z6839 Body mass index (BMI) 39.0-39.9, adult: Secondary | ICD-10-CM | POA: Diagnosis not present

## 2014-10-01 DIAGNOSIS — Z3490 Encounter for supervision of normal pregnancy, unspecified, unspecified trimester: Secondary | ICD-10-CM

## 2014-10-01 DIAGNOSIS — Z3A39 39 weeks gestation of pregnancy: Secondary | ICD-10-CM | POA: Diagnosis present

## 2014-10-01 DIAGNOSIS — O9902 Anemia complicating childbirth: Secondary | ICD-10-CM | POA: Diagnosis present

## 2014-10-01 DIAGNOSIS — D649 Anemia, unspecified: Secondary | ICD-10-CM | POA: Diagnosis present

## 2014-10-01 DIAGNOSIS — O479 False labor, unspecified: Secondary | ICD-10-CM

## 2014-10-01 LAB — CBC
HEMATOCRIT: 35.3 % — AB (ref 36.0–46.0)
Hemoglobin: 12.1 g/dL (ref 12.0–15.0)
MCH: 30.1 pg (ref 26.0–34.0)
MCHC: 34.3 g/dL (ref 30.0–36.0)
MCV: 87.8 fL (ref 78.0–100.0)
PLATELETS: 218 10*3/uL (ref 150–400)
RBC: 4.02 MIL/uL (ref 3.87–5.11)
RDW: 14.5 % (ref 11.5–15.5)
WBC: 11.5 10*3/uL — ABNORMAL HIGH (ref 4.0–10.5)

## 2014-10-01 LAB — RPR

## 2014-10-01 LAB — TYPE AND SCREEN
ABO/RH(D): A POS
Antibody Screen: NEGATIVE

## 2014-10-01 LAB — ABO/RH: ABO/RH(D): A POS

## 2014-10-01 LAB — RAPID HIV SCREEN (WH-MAU): SUDS RAPID HIV SCREEN: NONREACTIVE

## 2014-10-01 MED ORDER — DIPHENHYDRAMINE HCL 50 MG/ML IJ SOLN: 12.5000 mg | INTRAMUSCULAR | Status: DC | PRN

## 2014-10-01 MED ORDER — EPHEDRINE 5 MG/ML INJ: 10.0000 mg | INTRAVENOUS | Status: DC | PRN

## 2014-10-01 MED ORDER — ZOLPIDEM TARTRATE 5 MG PO TABS: 5.0000 mg | ORAL_TABLET | Freq: Every evening | ORAL | Status: DC | PRN

## 2014-10-01 MED ORDER — FENTANYL CITRATE 0.05 MG/ML IJ SOLN
100.0000 ug | INTRAMUSCULAR | Status: DC | PRN
  Administered 2014-10-01: 100 ug via INTRAVENOUS
  Filled 2014-10-01 (×2): qty 2

## 2014-10-01 MED ORDER — ONDANSETRON HCL 4 MG/2ML IJ SOLN: 4.0000 mg | Freq: Four times a day (QID) | INTRAMUSCULAR | Status: DC | PRN

## 2014-10-01 MED ORDER — LACTATED RINGERS IV SOLN
INTRAVENOUS | Status: DC
  Administered 2014-10-01 – 2014-10-02 (×5): via INTRAVENOUS

## 2014-10-01 MED ORDER — OXYTOCIN 40 UNITS IN LACTATED RINGERS INFUSION - SIMPLE MED: 62.5000 mL/h | INTRAVENOUS | Status: DC

## 2014-10-01 MED ORDER — LIDOCAINE HCL (PF) 1 % IJ SOLN
INTRAMUSCULAR | Status: DC | PRN
  Administered 2014-10-01 (×2): 9 mL

## 2014-10-01 MED ORDER — FENTANYL CITRATE 0.05 MG/ML IJ SOLN
100.0000 ug | INTRAMUSCULAR | Status: DC | PRN
  Administered 2014-10-01 (×2): 100 ug via INTRAVENOUS
  Filled 2014-10-01: qty 2

## 2014-10-01 MED ORDER — ACETAMINOPHEN 325 MG PO TABS: 650.0000 mg | ORAL_TABLET | ORAL | Status: DC | PRN

## 2014-10-01 MED ORDER — PHENYLEPHRINE 40 MCG/ML (10ML) SYRINGE FOR IV PUSH (FOR BLOOD PRESSURE SUPPORT)
80.0000 ug | PREFILLED_SYRINGE | INTRAVENOUS | Status: DC | PRN
  Filled 2014-10-01: qty 10

## 2014-10-01 MED ORDER — OXYCODONE-ACETAMINOPHEN 5-325 MG PO TABS: 1.0000 | ORAL_TABLET | ORAL | Status: DC | PRN

## 2014-10-01 MED ORDER — LACTATED RINGERS IV SOLN: 500.0000 mL | INTRAVENOUS | Status: DC | PRN

## 2014-10-01 MED ORDER — FENTANYL 2.5 MCG/ML BUPIVACAINE 1/10 % EPIDURAL INFUSION (WH - ANES)
INTRAMUSCULAR | Status: DC | PRN
  Administered 2014-10-01: 14 mL/h via EPIDURAL

## 2014-10-01 MED ORDER — FENTANYL CITRATE 0.05 MG/ML IJ SOLN
INTRAMUSCULAR | Status: AC
  Administered 2014-10-01: 100 ug via INTRAVENOUS
  Filled 2014-10-01: qty 2

## 2014-10-01 MED ORDER — FLEET ENEMA 7-19 GM/118ML RE ENEM: 1.0000 | ENEMA | RECTAL | Status: DC | PRN

## 2014-10-01 MED ORDER — PHENYLEPHRINE 40 MCG/ML (10ML) SYRINGE FOR IV PUSH (FOR BLOOD PRESSURE SUPPORT): 80.0000 ug | PREFILLED_SYRINGE | INTRAVENOUS | Status: DC | PRN

## 2014-10-01 MED ORDER — LIDOCAINE HCL (PF) 1 % IJ SOLN: 30.0000 mL | INTRAMUSCULAR | Status: DC | PRN

## 2014-10-01 MED ORDER — OXYTOCIN 40 UNITS IN LACTATED RINGERS INFUSION - SIMPLE MED
1.0000 m[IU]/min | INTRAVENOUS | Status: DC
  Administered 2014-10-01: 2 m[IU]/min via INTRAVENOUS
  Filled 2014-10-01: qty 1000

## 2014-10-01 MED ORDER — LACTATED RINGERS IV SOLN
500.0000 mL | Freq: Once | INTRAVENOUS | Status: AC
  Administered 2014-10-01: 500 mL via INTRAVENOUS

## 2014-10-01 MED ORDER — FENTANYL 2.5 MCG/ML BUPIVACAINE 1/10 % EPIDURAL INFUSION (WH - ANES)
14.0000 mL/h | INTRAMUSCULAR | Status: DC | PRN
  Administered 2014-10-01 – 2014-10-02 (×3): 14 mL/h via EPIDURAL
  Filled 2014-10-01 (×3): qty 125

## 2014-10-01 MED ORDER — OXYTOCIN BOLUS FROM INFUSION: 500.0000 mL | INTRAVENOUS | Status: DC

## 2014-10-01 MED ORDER — TERBUTALINE SULFATE 1 MG/ML IJ SOLN: 0.2500 mg | Freq: Once | INTRAMUSCULAR | Status: AC | PRN

## 2014-10-01 MED ORDER — OXYCODONE-ACETAMINOPHEN 5-325 MG PO TABS: 2.0000 | ORAL_TABLET | ORAL | Status: DC | PRN

## 2014-10-01 MED ORDER — CITRIC ACID-SODIUM CITRATE 334-500 MG/5ML PO SOLN
30.0000 mL | ORAL | Status: DC | PRN
  Administered 2014-10-02: 30 mL via ORAL
  Filled 2014-10-01: qty 15

## 2014-10-01 NOTE — Anesthesia Procedure Notes (Addendum)
Epidural Patient location during procedure: OB Start time: 10/01/2014 9:47 PM End time: 10/01/2014 9:51 PM  Staffing Anesthesiologist: Leilani AbleHATCHETT, FRANKLIN  Preanesthetic Checklist Completed: patient identified, surgical consent, pre-op evaluation, timeout performed, IV checked, risks and benefits discussed and monitors and equipment checked  Epidural Patient position: sitting Prep: site prepped and draped and DuraPrep Patient monitoring: continuous pulse ox and blood pressure Approach: midline Location: L3-L4 Injection technique: LOR air  Needle:  Needle type: Tuohy  Needle gauge: 17 G Needle length: 9 cm and 9 Needle insertion depth: 6 cm Catheter type: closed end flexible Catheter size: 19 Gauge Catheter at skin depth: 11 cm Test dose: negative and Other  Assessment Events: blood not aspirated, injection not painful, no injection resistance, negative IV test and no paresthesia  Additional Notes Reason for block:procedure for pain  Spinal Patient location during procedure: OR Start time: 10/02/2014 12:20 PM Staffing Anesthesiologist: CASSIDY, AMY Performed by: anesthesiologist  Preanesthetic Checklist Completed: patient identified, site marked, surgical consent, pre-op evaluation, timeout performed, IV checked, risks and benefits discussed and monitors and equipment checked Spinal Block Patient position: sitting Prep: site prepped and draped and DuraPrep Patient monitoring: cardiac monitor, continuous pulse ox, blood pressure and heart rate Approach: midline Location: L4-5 Injection technique: catheter Needle Needle type: Tuohy and Pencan  Needle gauge: 24 G Needle length: 12.7 cm Needle insertion depth: 6 cm Catheter type: closed end flexible Catheter size: 19 g Catheter at skin depth: 11 cm Assessment Sensory level: T4 Additional Notes Labor epidural working well on left, but no level on right.  Removed, tip intact.  CSE for C/S.  LOR to air at 6 cm.   Pencan passed via tuohy with immediate return of clear free flow CSF.  No paresthesia.  SAB dose given, pencan removed.  Tuohy flused with 2.5 ml saline, epidural catheter threaded easily.  Secured at 11 cm at skin.  EPIDURAL CATHETER NOT TESTED.  Patient tolerated procedure well with no apparent complications.  Jasmine DecemberA. Cassidy, MD

## 2014-10-01 NOTE — Plan of Care (Signed)
Problem: Phase I Progression Outcomes Goal: Assess per MD/Nurse,Routine-VS,FHR,UC,Head to Toe assess Outcome: Completed/Met Date Met:  10/01/14 Goal: Pain controlled with appropriate interventions Outcome: Completed/Met Date Met:  10/01/14 Goal: OOB as tolerated unless otherwise ordered Outcome: Completed/Met Date Met:  10/01/14 Goal: Medications/IV Fluids N/A Outcome: Completed/Met Date Met:  10/01/14 Goal: IV Pain medications as ordered Outcome: Completed/Met Date Met:  10/01/14 Goal: Assess/evaluate labor progress and adequacy Outcome: Completed/Met Date Met:  10/01/14 Goal: Discharge home if all goals are met Outcome: Not Applicable Date Met:  81/19/14 Goal: Medical plan of care initiated within 2 hrs of admission Outcome: Completed/Met Date Met:  10/01/14  Problem: Phase II Progression Outcomes Goal: Fetal monitoring per orders Outcome: Completed/Met Date Met:  10/01/14 Goal: Frequent position change(s) Outcome: Completed/Met Date Met:  10/01/14 Goal: Empty bladder prn Outcome: Completed/Met Date Met:  10/01/14 Goal: Activity as indicated Outcome: Completed/Met Date Met:  10/01/14 Goal: Clear liquid diet Outcome: Completed/Met Date Met:  10/01/14

## 2014-10-01 NOTE — Progress Notes (Signed)
Amber KatzOfelia Warren is a 23 y.o. G1P0000 at 8555w2d by ultrasound admitted for active labor  Subjective:   Objective: BP 121/45 mmHg  Pulse 77  Temp(Src) 97.4 F (36.3 C) (Oral)  Resp 18  Ht 4\' 10"  (1.473 m)  Wt 84.823 kg (187 lb)  BMI 39.09 kg/m2  LMP 12/01/2013      FHT:  FHR: 145 bpm, variability: moderate,  accelerations:  Present,  decelerations:  Absent UC:   regular, every 3 minutes SVE:   Dilation: 6 Effacement (%): 100 Station: Ballotable Exam by:: [email or web address]  Labs: Lab Results  Component Value Date   WBC 11.5* 10/01/2014   HGB 12.1 10/01/2014   HCT 35.3* 10/01/2014   MCV 87.8 10/01/2014   PLT 218 10/01/2014    Assessment / Plan: Spontaneous labor, progressing normally  Labor: Progressing normally Preeclampsia:  no signs or symptoms of toxicity Fetal Wellbeing:  Category I Pain Control:  Fentanyl I/D:  n/a Anticipated MOD:  NSVD  Benjamin Stainhompson, Dezaria Methot L 10/01/2014, 7:33 PM

## 2014-10-01 NOTE — H&P (Signed)
Amber KatzOfelia Warren is a 23 y.o. female presenting for onset of labor.  Pt is a G1P0000 4323w2d who began having contractions last night.  Pt denies any bleeding, fluid leak, or decreased fetal movement.    Maternal Medical History:  Reason for admission: Contractions.   Contractions: Onset was yesterday.   Frequency: regular.   Duration is approximately 30 seconds.   Perceived severity is moderate.    Fetal activity: Perceived fetal activity is normal.   Last perceived fetal movement was within the past hour.    Prenatal complications: No bleeding, cholelithiasis, HIV, PIH, infection, IUGR, nephrolithiasis, oligohydramnios, placental abnormality, polyhydramnios, pre-eclampsia, preterm labor, substance abuse, thrombocytopenia or thrombophilia.   Prenatal Complications - Diabetes: none.    OB History    Gravida Para Term Preterm AB TAB SAB Ectopic Multiple Living   1 0 0 0 0 0 0 0       Past Medical History  Diagnosis Date  . Dysmenorrhea   . PCO (polycystic ovaries)   . Obesity (BMI 30-39.9)   . Anemia    Past Surgical History  Procedure Laterality Date  . No past surgeries     Family History: family history is not on file. Social History:  reports that she has never smoked. She has never used smokeless tobacco. She reports that she does not drink alcohol or use illicit drugs.   Prenatal Transfer Tool  Maternal Diabetes: No Genetic Screening: Normal Maternal Ultrasounds/Referrals: Normal Fetal Ultrasounds or other Referrals:  None Maternal Substance Abuse:  No Significant Maternal Medications:  None Significant Maternal Lab Results:  None Other Comments:  None  Review of Systems  Constitutional: Negative.   HENT: Negative.   Eyes: Negative.   Respiratory: Negative.   Cardiovascular: Negative.   Gastrointestinal: Negative.   Musculoskeletal: Negative.   Skin: Negative.   Neurological: Negative.   Endo/Heme/Allergies: Negative.   Psychiatric/Behavioral:  Negative.     Dilation: 5.5 Effacement (%): 90 Station: Ballotable Exam by:: cheryl motte, rn Blood pressure 117/47, pulse 93, temperature 98 F (36.7 C), temperature source Oral, resp. rate 22, last menstrual period 12/01/2013. Maternal Exam:  Uterine Assessment: Contraction strength is firm.  Contraction duration is 1 minute. Contraction frequency is regular.   Abdomen: Patient reports no abdominal tenderness. Surgical scars: low transverse.   Fetal presentation: vertex  Introitus: Normal vagina.  Ferning test: not done.  Nitrazine test: not done. Amniotic fluid character: not assessed.  Pelvis: adequate for delivery.   Cervix: Cervix evaluated by digital exam.     Physical Exam  Constitutional: She is oriented to person, place, and time. She appears well-developed and well-nourished.  HENT:  Head: Normocephalic and atraumatic.  Eyes: Conjunctivae and EOM are normal. Pupils are equal, round, and reactive to light.  Neck: Normal range of motion. Neck supple.  Cardiovascular: Normal rate and regular rhythm.   Respiratory: Effort normal.  GI: Soft.  Genitourinary: Vagina normal and uterus normal.  Musculoskeletal: Normal range of motion.  Neurological: She is alert and oriented to person, place, and time. She has normal reflexes.  Skin: Skin is warm and dry.  Psychiatric: She has a normal mood and affect. Her behavior is normal.    Prenatal labs: ABO, Rh: A/Positive/-- (06/15 0000) Antibody:  neg  Rubella:  immune RPR:   NR HBsAg:   neg HIV:   neg GBS: Negative (11/12 0000)   Assessment/Plan: Admit to L&D for expectant management and anticipation of NSVD.   Pain control per pt request.  Maurie Boettcherhompson, McKenzie L 10/01/2014, 3:32 PM  I have seen this patient and agree with the above resident's note.  LEFTWICH-KIRBY, Ammar Moffatt Certified Nurse-Midwife

## 2014-10-01 NOTE — Plan of Care (Signed)
Problem: Phase I Progression Outcomes Goal: Obtain and review prenatal records Outcome: Completed/Met Date Met:  10/01/14 Goal: Induction meds as ordered Outcome: Not Applicable Date Met:  10/01/14 Goal: Assess/evaluate cervical exam prn (q2hrs in active phase) Outcome: Completed/Met Date Met:  10/01/14 Goal: Appropriate patient level of comfort Outcome: Completed/Met Date Met:  10/01/14     

## 2014-10-01 NOTE — Anesthesia Preprocedure Evaluation (Addendum)
Anesthesia Evaluation  Patient identified by MRN, date of birth, ID band Patient awake    Reviewed: Allergy & Precautions, H&P , NPO status , Patient's Chart, lab work & pertinent test results  Airway Mallampati: II  TM Distance: >3 FB Neck ROM: full    Dental no notable dental hx.    Pulmonary neg pulmonary ROS,  breath sounds clear to auscultation  Pulmonary exam normal       Cardiovascular negative cardio ROS      Neuro/Psych negative neurological ROS  negative psych ROS   GI/Hepatic negative GI ROS, Neg liver ROS,   Endo/Other  Morbid obesity  Renal/GU negative Renal ROS     Musculoskeletal   Abdominal (+) + obese,   Peds  Hematology negative hematology ROS (+)   Anesthesia Other Findings   Reproductive/Obstetrics (+) Pregnancy (failure to progress --> C/S)                            Anesthesia Physical Anesthesia Plan  ASA: III and emergent  Anesthesia Plan: Epidural   Post-op Pain Management:    Induction:   Airway Management Planned:   Additional Equipment:   Intra-op Plan:   Post-operative Plan:   Informed Consent: I have reviewed the patients History and Physical, chart, labs and discussed the procedure including the risks, benefits and alternatives for the proposed anesthesia with the patient or authorized representative who has indicated his/her understanding and acceptance.     Plan Discussed with: Surgeon and CRNA  Anesthesia Plan Comments:        Anesthesia Quick Evaluation

## 2014-10-01 NOTE — Plan of Care (Signed)
Problem: Consults Goal: Birthing Suites Patient Information Press F2 to bring up selections list  Outcome: Completed/Met Date Met:  10/01/14  Pt 37-[redacted] weeks EGA and Non-English [employer] for assessments, procedures, questions, etc

## 2014-10-01 NOTE — MAU Note (Signed)
uc's 5-8 minutes apart all night, having bloody show, denies LOF.  Seen in MAU last night.

## 2014-10-02 ENCOUNTER — Encounter (HOSPITAL_COMMUNITY)
Admission: AD | Disposition: A | Discharge status: Home / Self Care | Payer: Self-pay | Source: Ambulatory Visit | Attending: Obstetrics & Gynecology

## 2014-10-02 ENCOUNTER — Encounter (HOSPITAL_COMMUNITY): Payer: Self-pay | Admitting: *Deleted

## 2014-10-02 SURGERY — Surgical Case
Anesthesia: Epidural

## 2014-10-02 MED ORDER — 0.9 % SODIUM CHLORIDE (POUR BTL) OPTIME
TOPICAL | Status: DC | PRN
  Administered 2014-10-02: 100 mL

## 2014-10-02 MED ORDER — KETOROLAC TROMETHAMINE 30 MG/ML IJ SOLN: 15.0000 mg | Freq: Once | INTRAMUSCULAR | Status: DC | PRN

## 2014-10-02 MED ORDER — SCOPOLAMINE 1 MG/3DAYS TD PT72: 1.0000 | MEDICATED_PATCH | Freq: Once | TRANSDERMAL | Status: DC

## 2014-10-02 MED ORDER — WITCH HAZEL-GLYCERIN EX PADS: 1.0000 "application " | MEDICATED_PAD | CUTANEOUS | Status: DC | PRN

## 2014-10-02 MED ORDER — ONDANSETRON HCL 4 MG/2ML IJ SOLN: 4.0000 mg | INTRAMUSCULAR | Status: DC | PRN

## 2014-10-02 MED ORDER — NALBUPHINE HCL 10 MG/ML IJ SOLN: 5.0000 mg | INTRAMUSCULAR | Status: DC | PRN

## 2014-10-02 MED ORDER — SODIUM BICARBONATE 8.4 % IV SOLN
INTRAVENOUS | Status: DC | PRN
  Administered 2014-10-02 (×3): 5 mL via EPIDURAL
  Administered 2014-10-02: 7 mL via EPIDURAL

## 2014-10-02 MED ORDER — OXYCODONE-ACETAMINOPHEN 5-325 MG PO TABS
2.0000 | ORAL_TABLET | ORAL | Status: DC | PRN
Start: 2014-10-02 — End: 2014-10-04
  Administered 2014-10-03: 2 via ORAL
  Filled 2014-10-02: qty 2

## 2014-10-02 MED ORDER — SIMETHICONE 80 MG PO CHEW: 80.0000 mg | CHEWABLE_TABLET | ORAL | Status: DC | PRN

## 2014-10-02 MED ORDER — PHENYLEPHRINE HCL 10 MG/ML IJ SOLN
INTRAMUSCULAR | Status: AC
  Filled 2014-10-02: qty 1

## 2014-10-02 MED ORDER — LANOLIN HYDROUS EX OINT: 1.0000 "application " | TOPICAL_OINTMENT | CUTANEOUS | Status: DC | PRN

## 2014-10-02 MED ORDER — TETANUS-DIPHTH-ACELL PERTUSSIS 5-2.5-18.5 LF-MCG/0.5 IM SUSP: 0.5000 mL | Freq: Once | INTRAMUSCULAR | Status: DC

## 2014-10-02 MED ORDER — MEPERIDINE HCL 25 MG/ML IJ SOLN
6.2500 mg | INTRAMUSCULAR | Status: DC | PRN
Start: 2014-10-02 — End: 2014-10-02

## 2014-10-02 MED ORDER — OXYTOCIN 10 UNIT/ML IJ SOLN
40.0000 [IU] | INTRAMUSCULAR | Status: DC | PRN
  Administered 2014-10-02: 40 [IU] via INTRAVENOUS

## 2014-10-02 MED ORDER — SCOPOLAMINE 1 MG/3DAYS TD PT72
MEDICATED_PATCH | TRANSDERMAL | Status: DC | PRN
  Administered 2014-10-02: 1 via TRANSDERMAL

## 2014-10-02 MED ORDER — DIPHENHYDRAMINE HCL 50 MG/ML IJ SOLN: 12.5000 mg | INTRAMUSCULAR | Status: DC | PRN

## 2014-10-02 MED ORDER — MORPHINE SULFATE 0.5 MG/ML IJ SOLN
INTRAMUSCULAR | Status: AC
  Filled 2014-10-02: qty 10

## 2014-10-02 MED ORDER — NALOXONE HCL 0.4 MG/ML IJ SOLN: 0.4000 mg | INTRAMUSCULAR | Status: DC | PRN

## 2014-10-02 MED ORDER — FENTANYL CITRATE 0.05 MG/ML IJ SOLN: 25.0000 ug | INTRAMUSCULAR | Status: DC | PRN

## 2014-10-02 MED ORDER — OXYTOCIN 10 UNIT/ML IJ SOLN
INTRAMUSCULAR | Status: AC
  Filled 2014-10-02: qty 4

## 2014-10-02 MED ORDER — BUPIVACAINE HCL (PF) 0.25 % IJ SOLN
INTRAMUSCULAR | Status: DC | PRN
  Administered 2014-10-02 (×2): 5 mL

## 2014-10-02 MED ORDER — FENTANYL CITRATE 0.05 MG/ML IJ SOLN
INTRAMUSCULAR | Status: AC
  Filled 2014-10-02: qty 2

## 2014-10-02 MED ORDER — PHENYLEPHRINE HCL 10 MG/ML IJ SOLN
INTRAMUSCULAR | Status: DC | PRN
  Administered 2014-10-02: 40 ug via INTRAVENOUS

## 2014-10-02 MED ORDER — MORPHINE SULFATE (PF) 0.5 MG/ML IJ SOLN
INTRAMUSCULAR | Status: DC | PRN
  Administered 2014-10-02: .1 mg via INTRATHECAL

## 2014-10-02 MED ORDER — SCOPOLAMINE 1 MG/3DAYS TD PT72
MEDICATED_PATCH | TRANSDERMAL | Status: AC
  Filled 2014-10-02: qty 1

## 2014-10-02 MED ORDER — PHENYLEPHRINE 8 MG IN D5W 100 ML (0.08MG/ML) PREMIX OPTIME
INJECTION | INTRAVENOUS | Status: DC | PRN
  Administered 2014-10-02: 60 ug/min via INTRAVENOUS

## 2014-10-02 MED ORDER — KETOROLAC TROMETHAMINE 30 MG/ML IJ SOLN
INTRAMUSCULAR | Status: AC
  Filled 2014-10-02: qty 1

## 2014-10-02 MED ORDER — BUPIVACAINE HCL (PF) 0.5 % IJ SOLN
INTRAMUSCULAR | Status: DC | PRN
  Administered 2014-10-02: 10 mL

## 2014-10-02 MED ORDER — OXYTOCIN 40 UNITS IN LACTATED RINGERS INFUSION - SIMPLE MED: 62.5000 mL/h | INTRAVENOUS | Status: AC

## 2014-10-02 MED ORDER — SENNOSIDES-DOCUSATE SODIUM 8.6-50 MG PO TABS
2.0000 | ORAL_TABLET | ORAL | Status: DC
  Administered 2014-10-03 – 2014-10-04 (×2): 2 via ORAL
  Filled 2014-10-02 (×2): qty 2

## 2014-10-02 MED ORDER — DIBUCAINE 1 % RE OINT: 1.0000 "application " | TOPICAL_OINTMENT | RECTAL | Status: DC | PRN

## 2014-10-02 MED ORDER — BUPIVACAINE IN DEXTROSE 0.75-8.25 % IT SOLN
INTRATHECAL | Status: DC | PRN
  Administered 2014-10-02: 1 mL via INTRATHECAL

## 2014-10-02 MED ORDER — DIPHENHYDRAMINE HCL 25 MG PO CAPS
25.0000 mg | ORAL_CAPSULE | ORAL | Status: DC | PRN
  Filled 2014-10-02: qty 1

## 2014-10-02 MED ORDER — OXYCODONE-ACETAMINOPHEN 5-325 MG PO TABS
1.0000 | ORAL_TABLET | ORAL | Status: DC | PRN
Start: 2014-10-02 — End: 2014-10-04

## 2014-10-02 MED ORDER — ONDANSETRON HCL 4 MG/2ML IJ SOLN
INTRAMUSCULAR | Status: DC | PRN
  Administered 2014-10-02: 4 mg via INTRAVENOUS

## 2014-10-02 MED ORDER — CEFAZOLIN SODIUM-DEXTROSE 2-3 GM-% IV SOLR
INTRAVENOUS | Status: DC | PRN
  Administered 2014-10-02: 2 g via INTRAVENOUS

## 2014-10-02 MED ORDER — ONDANSETRON HCL 4 MG PO TABS: 4.0000 mg | ORAL_TABLET | ORAL | Status: DC | PRN

## 2014-10-02 MED ORDER — KETOROLAC TROMETHAMINE 30 MG/ML IJ SOLN
30.0000 mg | Freq: Four times a day (QID) | INTRAMUSCULAR | Status: AC | PRN
  Administered 2014-10-02: 30 mg via INTRAMUSCULAR

## 2014-10-02 MED ORDER — DIPHENHYDRAMINE HCL 25 MG PO CAPS: 25.0000 mg | ORAL_CAPSULE | Freq: Four times a day (QID) | ORAL | Status: DC | PRN

## 2014-10-02 MED ORDER — IBUPROFEN 600 MG PO TABS
600.0000 mg | ORAL_TABLET | Freq: Four times a day (QID) | ORAL | Status: DC
  Administered 2014-10-03 – 2014-10-04 (×6): 600 mg via ORAL
  Filled 2014-10-02 (×6): qty 1

## 2014-10-02 MED ORDER — SIMETHICONE 80 MG PO CHEW
80.0000 mg | CHEWABLE_TABLET | ORAL | Status: DC
  Administered 2014-10-03 – 2014-10-04 (×2): 80 mg via ORAL
  Filled 2014-10-02 (×2): qty 1

## 2014-10-02 MED ORDER — PRENATAL MULTIVITAMIN CH
1.0000 | ORAL_TABLET | Freq: Every day | ORAL | Status: DC
  Administered 2014-10-03: 1 via ORAL
  Filled 2014-10-02 (×2): qty 1

## 2014-10-02 MED ORDER — PHENYLEPHRINE 40 MCG/ML (10ML) SYRINGE FOR IV PUSH (FOR BLOOD PRESSURE SUPPORT)
PREFILLED_SYRINGE | INTRAVENOUS | Status: AC
  Filled 2014-10-02: qty 5

## 2014-10-02 MED ORDER — LACTATED RINGERS IV SOLN
INTRAVENOUS | Status: DC
  Administered 2014-10-02: 22:00:00 via INTRAVENOUS

## 2014-10-02 MED ORDER — ONDANSETRON HCL 4 MG/2ML IJ SOLN
INTRAMUSCULAR | Status: AC
  Filled 2014-10-02: qty 4

## 2014-10-02 MED ORDER — NALBUPHINE HCL 10 MG/ML IJ SOLN: 5.0000 mg | Freq: Once | INTRAMUSCULAR | Status: AC | PRN

## 2014-10-02 MED ORDER — KETOROLAC TROMETHAMINE 30 MG/ML IJ SOLN: 30.0000 mg | Freq: Four times a day (QID) | INTRAMUSCULAR | Status: AC | PRN

## 2014-10-02 MED ORDER — MENTHOL 3 MG MT LOZG: 1.0000 | LOZENGE | OROMUCOSAL | Status: DC | PRN

## 2014-10-02 MED ORDER — ZOLPIDEM TARTRATE 5 MG PO TABS: 5.0000 mg | ORAL_TABLET | Freq: Every evening | ORAL | Status: DC | PRN

## 2014-10-02 MED ORDER — BUPIVACAINE HCL (PF) 0.5 % IJ SOLN
INTRAMUSCULAR | Status: AC
  Filled 2014-10-02: qty 30

## 2014-10-02 MED ORDER — MEPERIDINE HCL 25 MG/ML IJ SOLN: 6.2500 mg | INTRAMUSCULAR | Status: DC | PRN

## 2014-10-02 MED ORDER — ONDANSETRON HCL 4 MG/2ML IJ SOLN: 4.0000 mg | Freq: Three times a day (TID) | INTRAMUSCULAR | Status: DC | PRN

## 2014-10-02 MED ORDER — BUPIVACAINE IN DEXTROSE 0.75-8.25 % IT SOLN
INTRATHECAL | Status: AC
  Filled 2014-10-02: qty 4

## 2014-10-02 MED ORDER — DEXAMETHASONE SODIUM PHOSPHATE 10 MG/ML IJ SOLN
INTRAMUSCULAR | Status: DC | PRN
  Administered 2014-10-02: 5 mg via INTRAVENOUS

## 2014-10-02 MED ORDER — DEXAMETHASONE SODIUM PHOSPHATE 10 MG/ML IJ SOLN
INTRAMUSCULAR | Status: AC
  Filled 2014-10-02: qty 1

## 2014-10-02 MED ORDER — FENTANYL CITRATE 0.05 MG/ML IJ SOLN
100.0000 ug | Freq: Once | INTRAMUSCULAR | Status: AC
  Administered 2014-10-02: 100 ug via EPIDURAL

## 2014-10-02 MED ORDER — NALOXONE HCL 1 MG/ML IJ SOLN
1.0000 ug/kg/h | INTRAVENOUS | Status: DC | PRN
  Filled 2014-10-02: qty 2

## 2014-10-02 MED ORDER — SODIUM CHLORIDE 0.9 % IJ SOLN: 3.0000 mL | INTRAMUSCULAR | Status: DC | PRN

## 2014-10-02 MED ORDER — FENTANYL CITRATE 0.05 MG/ML IJ SOLN
INTRAMUSCULAR | Status: DC | PRN
  Administered 2014-10-02: 15 ug via INTRATHECAL

## 2014-10-02 MED ORDER — PROMETHAZINE HCL 25 MG/ML IJ SOLN
6.2500 mg | INTRAMUSCULAR | Status: DC | PRN
Start: 2014-10-02 — End: 2014-10-02

## 2014-10-02 MED ORDER — SIMETHICONE 80 MG PO CHEW
80.0000 mg | CHEWABLE_TABLET | Freq: Three times a day (TID) | ORAL | Status: DC
  Administered 2014-10-03 – 2014-10-04 (×4): 80 mg via ORAL
  Filled 2014-10-02 (×5): qty 1

## 2014-10-02 SURGICAL SUPPLY — 25 items
BARRIER ADHS 3X4 INTERCEED (GAUZE/BANDAGES/DRESSINGS) IMPLANT
CLAMP CORD UMBIL (MISCELLANEOUS) IMPLANT
CLOTH BEACON ORANGE TIMEOUT ST (SAFETY) ×2 IMPLANT
DRAPE SHEET LG 3/4 BI-LAMINATE (DRAPES) IMPLANT
DRSG OPSITE POSTOP 4X10 (GAUZE/BANDAGES/DRESSINGS) ×2 IMPLANT
DURAPREP 26ML APPLICATOR (WOUND CARE) ×2 IMPLANT
ELECT REM PT RETURN 9FT ADLT (ELECTROSURGICAL) ×2
ELECTRODE REM PT RTRN 9FT ADLT (ELECTROSURGICAL) ×1 IMPLANT
EXTRACTOR VACUUM KIWI (MISCELLANEOUS) IMPLANT
GLOVE BIO SURGEON STRL SZ 6.5 (GLOVE) ×2 IMPLANT
GLOVE BIOGEL PI IND STRL 7.0 (GLOVE) ×1 IMPLANT
GLOVE BIOGEL PI INDICATOR 7.0 (GLOVE) ×1
GOWN STRL REUS W/TWL LRG LVL3 (GOWN DISPOSABLE) ×4 IMPLANT
KIT ABG SYR 3ML LUER SLIP (SYRINGE) IMPLANT
NEEDLE HYPO 25X5/8 SAFETYGLIDE (NEEDLE) IMPLANT
NS IRRIG 1000ML POUR BTL (IV SOLUTION) ×2 IMPLANT
PACK C SECTION WH (CUSTOM PROCEDURE TRAY) ×2 IMPLANT
PAD OB MATERNITY 4.3X12.25 (PERSONAL CARE ITEMS) ×2 IMPLANT
SUT VIC AB 0 CT1 36 (SUTURE) ×12 IMPLANT
SUT VIC AB 2-0 CT1 27 (SUTURE) ×1
SUT VIC AB 2-0 CT1 TAPERPNT 27 (SUTURE) ×1 IMPLANT
SUT VIC AB 4-0 PS2 27 (SUTURE) ×2 IMPLANT
TOWEL OR 17X24 6PK STRL BLUE (TOWEL DISPOSABLE) ×2 IMPLANT
TRAY FOLEY CATH 14FR (SET/KITS/TRAYS/PACK) IMPLANT
WATER STERILE IRR 1000ML POUR (IV SOLUTION) ×2 IMPLANT

## 2014-10-02 NOTE — Anesthesia Postprocedure Evaluation (Signed)
  Anesthesia Post-op Note  Patient: Amber Warren  Procedure(s) Performed: Procedure(s): CESAREAN SECTION (N/A)  Patient Location: Mother/Baby  Anesthesia Type:Epidural  Level of Consciousness: awake  Airway and Oxygen Therapy: Patient Spontanous Breathing  Post-op Pain: mild  Post-op Assessment: Patient's Cardiovascular Status Stable and Respiratory Function Stable  Post-op Vital Signs: stable  Last Vitals:  Filed Vitals:   10/02/14 1458  BP: 100/44  Pulse:   Temp: 38.2 C  Resp: 16    Complications: No apparent anesthesia complications

## 2014-10-02 NOTE — Op Note (Signed)
Cesarean Section Procedure Note   Amber Warren  10/01/2014 - 10/02/2014  Indications: active phase arrest   Pre-operative Diagnosis: CESAREAN SECTION, ARREST OF DESCENT.   Post-operative Diagnosis: Same   Surgeon: Surgeon(s) and Role:    * Adam PhenixJames G Caysen Whang, MD - Primary   Assistants: none  Anesthesia: epidural, spinal   Procedure Details:  The patient was seen in the Room 167. Active phase arrest at 6 cm.The risks, benefits, complications, treatment options, and expected outcomes were discussed with the patient. The patient concurred with the proposed plan, giving informed consent. identified as Surveyor, mineralsfelia Warren and the procedure verified as C-Section Delivery. A Time Out was held and the above information confirmed.  After induction of anesthesia, the patient was draped and prepped in the usual sterile manner. A transverse was made and carried down through the subcutaneous tissue to the fascia. Fascial incision was made and extended transversely. The fascia was separated from the underlying rectus tissue superiorly and inferiorly. The peritoneum was identified and entered. Peritoneal incision was extended longitudinally. The utero-vesical peritoneal reflection was incised transversely and the bladder flap was bluntly freed from the lower uterine segment. A low transverse uterine incision was made. Delivered from cephalic presentation was a 8 lb 9 oz Living newborn infant(s) or Female with Apgar scores of 9 at one minute and 9 at five minutes. Cord ph was not sent the umbilical cord was clamped and cut cord blood was obtained for evaluation. The placenta was removed Intact and appeared normal. The uterine outline, tubes and ovaries appeared normal}. The uterine incision was closed with running locked sutures of 0Vicryl and the incision was imbricated   Hemostasis was observed. Peritoneum was closed with 2-0 Vicryl. The fascia was then reapproximated with running sutures of 0Vicryl.  The subcutaneous closure was performed using 2-0plain gut. The skin was closed with 4-0Vicryl. Honeycomb dressing placed.  Instrument, sponge, and needle counts were correct prior the abdominal closure and were correct at the conclusion of the case.    Findings:   Estimated Blood Loss: 1100 ml  Total IV Fluids: 2000 ml   Urine Output: 100CC OF clear urine  Complications: no complications  Disposition: PACU - hemodynamically stable.   Maternal Condition: stable   Baby condition / location:  Couplet care / Skin to Skin  Attending Attestation: I was present and scrubbed for the entire procedure.   Signed: Surgeon(s): Adam PhenixJames G Ramandeep Arington, MD  10/02/2014 1:51 PM

## 2014-10-02 NOTE — Progress Notes (Signed)
I assisted Heather,RN with questions about pain control. Eda H Royal  Interpreter.

## 2014-10-02 NOTE — Plan of Care (Signed)
Problem: Phase II Progression Outcomes Goal: Tolerating diet Outcome: Completed/Met Date Met:  10/02/14     

## 2014-10-02 NOTE — Progress Notes (Signed)
Amber KatzOfelia Warren is a 23 y.o. G1P0000 at 6640w3d by ultrasound admitted for active labor  Subjective: Pt is doing well.  Feeling some pressure in her back like she wants to push.    Objective: BP 109/61 mmHg  Pulse 93  Temp(Src) 98 F (36.7 C) (Oral)  Resp 18  Ht 4\' 10"  (1.473 m)  Wt 84.823 kg (187 lb)  BMI 39.09 kg/m2  SpO2 99%  LMP 12/01/2013   Total I/O In: -  Out: 900 [Urine:900]  FHT:  FHR: 128 bpm, variability: moderate,  accelerations:  Present,  decelerations:  Absent UC:   none, regular, every 2 minutes SVE:   Dilation: 8 Effacement (%): 80 Station: -2 Exam by:: henville, md  Labs: Lab Results  Component Value Date   WBC 11.5* 10/01/2014   HGB 12.1 10/01/2014   HCT 35.3* 10/01/2014   MCV 87.8 10/01/2014   PLT 218 10/01/2014    Assessment / Plan: Protracted active phase  Labor: Pt is progressing slowly, but making progress.   Preeclampsia:  no signs or symptoms of toxicity Fetal Wellbeing:  Category I Pain Control:  Epidural I/D:  n/a Anticipated MOD:  NSVD  Amber Warren, Amber Warren 10/02/2014, 9:20 AM

## 2014-10-02 NOTE — Anesthesia Postprocedure Evaluation (Signed)
  Anesthesia Post-op Note  Anesthesia Post Note  Patient: Amber Warren  Procedure(s) Performed: Procedure(s) (LRB): CESAREAN SECTION (N/A)  Anesthesia type: Spinal/Epidural  Patient location: PACU  Post pain: Pain level controlled  Post assessment: Post-op Vital signs reviewed  Last Vitals:  Filed Vitals:   10/02/14 1458  BP: 100/44  Pulse:   Temp: 38.2 C  Resp: 16    Post vital signs: Reviewed  Level of consciousness: awake  Complications: No apparent anesthesia complications

## 2014-10-02 NOTE — Plan of Care (Signed)
Problem: Phase I Progression Outcomes Goal: VS, stable, temp < 100.4 degrees F Outcome: Completed/Met Date Met:  10/02/14     

## 2014-10-02 NOTE — Progress Notes (Signed)
Amber KatzOfelia Warren is a 23 y.o. G1P0000 at 6756w3d by ultrasound admitted for active labor  Subjective:pain relief inadequate   Objective: BP 93/66 mmHg  Pulse 124  Temp(Src) 99.9 F (37.7 C) (Axillary)  Resp 18  Ht 4\' 10"  (1.473 m)  Wt 187 lb (84.823 kg)  BMI 39.09 kg/m2  SpO2 99%  LMP 12/01/2013   Total I/O In: -  Out: 900 [Urine:900]  FHT:  170 UC:   regular, every 3 minutes SVE:   Dilation: 6 Effacement (%):  (swollen) Station: -2, -3 Exam by:: arnold, md  Labs: Lab Results  Component Value Date   WBC 11.5* 10/01/2014   HGB 12.1 10/01/2014   HCT 35.3* 10/01/2014   MCV 87.8 10/01/2014   PLT 218 10/01/2014    Assessment / Plan: Arrest in active phase of labor  Labor: arrest active phase Preeclampsia:  no signs or symptoms of toxicity Fetal Wellbeing:  Category I Pain Control:  Epidural I/D:  n/a Anticipated MOD:  cesarean section offered,  The procedure and the risk of anesthesia, bleeding, infection, bowel and bladder injury, transfusion were discussed and her questions were answered.    ARNOLD,JAMES 10/02/2014, 12:11 PM

## 2014-10-02 NOTE — Progress Notes (Signed)
I stopped by patients room to check on her needs, by Amber Warren; Interpreter °

## 2014-10-02 NOTE — Plan of Care (Signed)
Problem: Phase I Progression Outcomes Goal: OOB as tolerated unless otherwise ordered Outcome: Completed/Met Date Met:  10/02/14 Goal: IS, TCDB as ordered Outcome: Completed/Met Date Met:  10/02/14

## 2014-10-02 NOTE — Progress Notes (Signed)
Amber KatzOfelia Warren is a 23 y.o. G1P0000 at 4027w3d   Subjective: Mostly comfortable with epidural; leaking thin MSF since approx 0345, and cx was 7cm at that time  Objective: BP 108/58 mmHg  Pulse 91  Temp(Src) 98 F (36.7 C) (Oral)  Resp 20  Ht 4\' 10"  (1.473 m)  Wt 84.823 kg (187 lb)  BMI 39.09 kg/m2  SpO2 99%  LMP 12/01/2013   Total I/O In: -  Out: 900 [Urine:900]  FHT:  FHR: 130s bpm, variability: moderate,  accelerations:  Present,  decelerations:  Absent- occ variables UC:   regular, every 3-4 minutes with Pit @ 1010mu/min SVE:   Dilation: 6 Effacement (%): 80, 90 Station: -2, -3 Exam by:: e. poore, rn- IUPC inserted without difficulty; cx now swollen and feels more lke 5cm  Labs: Lab Results  Component Value Date   WBC 11.5* 10/01/2014   HGB 12.1 10/01/2014   HCT 35.3* 10/01/2014   MCV 87.8 10/01/2014   PLT 218 10/01/2014    Assessment / Plan: IUP @ term Protracted active phase  Report to oncoming CNM, Dorathy KinsmanVirginia Smith of plan to eval MVUs for adequacy and then determine plan of care  Cam HaiSHAW, Elaria Osias CNM 10/02/2014, 8:55 AM

## 2014-10-02 NOTE — Plan of Care (Signed)
Problem: Phase I Progression Outcomes Goal: Pain controlled with appropriate interventions Outcome: Completed/Met Date Met:  10/02/14 Goal: Foley catheter patent Outcome: Completed/Met Date Met:  10/02/14 Goal: Initial discharge plan identified Outcome: Completed/Met Date Met:  10/02/14

## 2014-10-02 NOTE — Addendum Note (Signed)
Addendum  created 10/02/14 1654 by Renford DillsJanet L Skylen Danielsen, CRNA   Modules edited: Notes Section   Notes Section:  File: [id number]

## 2014-10-02 NOTE — Plan of Care (Signed)
Problem: Phase I Progression Outcomes Goal: Pitocin as ordered Outcome: Completed/Met Date Met:  10/02/14 Goal: FHR checked 5 minutes after meds (ROM) Rupture of Membranes Outcome: Completed/Met Date Met:  10/02/14  Problem: Phase II Progression Outcomes Goal: Adjust medications prn Outcome: Completed/Met Date Met:  10/02/14 Goal: Notify MD prior to epidural redose Outcome: Completed/Met Date Met:  10/02/14

## 2014-10-02 NOTE — Transfer of Care (Signed)
Immediate Anesthesia Transfer of Care Note  Patient: Amber Warren  Procedure(s) Performed: Procedure(s): CESAREAN SECTION (N/A)  Patient Location: PACU  Anesthesia Type:Spinal and Epidural  Level of Consciousness: awake, alert  and oriented  Airway & Oxygen Therapy: Patient Spontanous Breathing  Post-op Assessment: Report given to PACU RN and Post -op Vital signs reviewed and stable  Post vital signs: Reviewed and stable  Complications: No apparent anesthesia complications

## 2014-10-03 LAB — CBC
HCT: 23.8 % — ABNORMAL LOW (ref 36.0–46.0)
Hemoglobin: 8.2 g/dL — ABNORMAL LOW (ref 12.0–15.0)
MCH: 30.1 pg (ref 26.0–34.0)
MCHC: 34.5 g/dL (ref 30.0–36.0)
MCV: 87.5 fL (ref 78.0–100.0)
Platelets: 176 10*3/uL (ref 150–400)
RBC: 2.72 MIL/uL — ABNORMAL LOW (ref 3.87–5.11)
RDW: 14.8 % (ref 11.5–15.5)
WBC: 19.2 10*3/uL — ABNORMAL HIGH (ref 4.0–10.5)

## 2014-10-03 NOTE — Progress Notes (Signed)
I assisted Kim Shaw CNM with questions. Eda H Royal Interpreter. °

## 2014-10-03 NOTE — Progress Notes (Signed)
I assisted Pediatrician Student with questions about the baby. Amber Warren  Interpreter.

## 2014-10-03 NOTE — Plan of Care (Signed)
Problem: Phase I Progression Outcomes Goal: Other Phase I Outcomes/Goals Outcome: Completed/Met Date Met:  10/03/14  Problem: Phase II Progression Outcomes Goal: Pain controlled on oral analgesia Outcome: Completed/Met Date Met:  10/03/14 Goal: Rh isoimmunization per orders Outcome: Not Applicable Date Met:  07/16/29 Goal: Other Phase II Outcomes/Goals Outcome: Completed/Met Date Met:  10/03/14  Problem: Discharge Progression Outcomes Goal: Tolerating diet Outcome: Completed/Met Date Met:  10/03/14 Goal: Discharge plan in place and appropriate Outcome: Completed/Met Date Met:  10/03/14

## 2014-10-03 NOTE — Progress Notes (Signed)
Stopped by to check on patient's needs and ordered her lunch. Amber Warren  Interpreter. °

## 2014-10-03 NOTE — Progress Notes (Signed)
I assisted Faculty Practice with questions.  Eda H Royal  Interpreter. °

## 2014-10-03 NOTE — Progress Notes (Signed)
In house interpreter contacted in order to assess mom's pain level and do head to toe assessment. Feedings/diapers were also updated.

## 2014-10-03 NOTE — Progress Notes (Signed)
I assisted MusicianJessica RN with some questions, by Orlan LeavensViria Alvarez Interpreter

## 2014-10-03 NOTE — Progress Notes (Signed)
In house spanish interpreter contacted in order to assess mom and to assess baby.

## 2014-10-03 NOTE — Lactation Note (Signed)
This note was copied from the chart of Amber Symantha Alarcon-Bautista. Lactation Consultation Note  Patient Name: Amber Nadeen LandauOfelia Alarcon-Bautista YQMVH'QToday's Date: 10/03/2014 Reason for consult: Initial assessment   Initial visit @ 32 hrs old; 39.3 GA; BW 8lbs, 7oz.  Mom speaks Spanish (interpreter needed) and is a P1.  According to the chart infant has breastfed x2 in past 24 hrs (yesterday) (x4 life) and formula via bottle x8 (7-13 ml); voids-5; stools-6 in past 24 hrs and life.  However, mom states she has been breastfeeding today but none has been documented; mom states she does not have any milk.  Educated mom and dad via interpreter about milk production, supply & demand, feeding with feeding cues, skin-to-skin, cluster feeding, and risks of formula feeding on mom's milk supply.  Encouraged breastfeeding prior to giving formula.  Day of Life Supplementation Guideline sheet given.  Lactation brochure given and informed of support group and outpatient services.     Maternal Data Formula Feeding for Exclusion: Yes Reason for exclusion: Mother's choice to formula and breast feed on admission Has patient been taught Hand Expression?: Yes  Feeding Feeding Type: Bottle Fed - Formula Nipple Type: Slow - flow  LATCH Score/Interventions                      Lactation Tools Discussed/Used Tools: Bottle WIC Program: No   Consult Status Consult Status: Follow-up Date: 10/04/14 Follow-up type: In-patient    Lendon KaVann, Ellesse Antenucci Walker 10/03/2014, 9:35 PM

## 2014-10-03 NOTE — Plan of Care (Signed)
Problem: Phase II Progression Outcomes Goal: Progress activity as tolerated unless otherwise ordered Outcome: Completed/Met Date Met:  10/03/14     

## 2014-10-03 NOTE — Progress Notes (Signed)
Subjective: Postpartum Day 1: Cesarean Delivery Patient reports tolerating PO, + flatus and no problems voiding.    Objective: Vital signs in last 24 hours: Temp:  [97.5 F (36.4 C)-100.7 F (38.2 C)] 97.5 F (36.4 C) (12/11 0435) Pulse Rate:  [79-124] 85 (12/11 0435) Resp:  [16-18] 18 (12/11 0435) BP: (90-124)/(42-81) 104/48 mmHg (12/11 0435) SpO2:  [91 %-99 %] 95 % (12/11 0435)  Physical Exam:  General: alert, cooperative and no distress Lochia: appropriate Uterine Fundus: firm Incision: healing well DVT Evaluation: No evidence of DVT seen on physical exam. Negative Homan's sign.   Recent Labs  10/01/14 1437 10/03/14 0535  HGB 12.1 8.2*  HCT 35.3* 23.8*    Assessment/Plan: Status post Cesarean section. Doing well postoperatively.  Continue current care. Will administer Iron pills due to hemoglobin dropping from 12.1 to 8.2  Laymond PurserFong, Tiki Tucciarone K 10/03/2014, 7:55 AM

## 2014-10-04 ENCOUNTER — Encounter (HOSPITAL_COMMUNITY): Payer: Self-pay | Admitting: Obstetrics & Gynecology

## 2014-10-04 MED ORDER — OXYCODONE-ACETAMINOPHEN 5-325 MG PO TABS: 1.0000 | ORAL_TABLET | ORAL | Status: DC | PRN

## 2014-10-04 MED ORDER — IBUPROFEN 600 MG PO TABS: 600.0000 mg | ORAL_TABLET | Freq: Four times a day (QID) | ORAL | Status: DC

## 2014-10-04 MED ORDER — FERROUS SULFATE 325 (65 FE) MG PO TABS: 325.0000 mg | ORAL_TABLET | Freq: Two times a day (BID) | ORAL | Status: DC

## 2014-10-04 NOTE — Discharge Instructions (Signed)
Parto por cesrea - Cuidados posteriores  (Cesarean Delivery, Care After) Siga estas instrucciones durante las prximas semanas. Estas indicaciones le proporcionan informacin general acerca de cmo deber cuidarse despus del procedimiento. El mdico tambin podr darle instrucciones ms especficas. El tratamiento se ha planificado de acuerdo a las prcticas mdicas actuales, pero a veces se producen problemas. Comunquese con el mdico si tiene algn problema o tiene dudas cuando vuelva a su casa.  INSTRUCCIONES PARA EL CUIDADO EN EL HOGAR  Tome slo medicamentos de venta libre o recetados, segn las indicaciones del mdico.  No beba alcohol, especialmente si est amamantando o toma analgsicos.  Nomastique tabaco ni fume.  Contine con un adecuado cuidado perineal. El buen cuidado perineal incluye:  Higienizarse de adelante hacia atrs.  Mantener la zona perineal limpia.  Controlar diariamente el corte (incisin) y observar si aumenta el enrojecimiento, si supura, se hincha o se separa la piel.  Limpie la incisin suavemente con jabn y agua todos los das, y luego squela dando golpecitos. Si el mdico la autoriza, deje la incisin al descubierto. Use un apsito (vendaje) si drena lquido o la incisin parece irritada. Si las pequeas tiras [facility] que cruzan la incisin no se caen dentro de los 7 das, retrelas suavemente.  Abrace una almohada al toser o estornudar hasta que la incisin se cure. Esto ayuda a Best boy.  No conduzca vehculos ni opere maquinarias hasta que el mdico la autorice.  Dchese, lvese el cabello y tome baos de inmersin segn las indicaciones de su mdico.  Utilice un sostn que le ajuste bien y que brinde buen soporte a sus Glass blower/designer.  Limite el uso de bombachas de sostn o medias panty.  Beba suficiente lquido para Consulting civil engineer orina clara o de color amarillo plido.  Consuma todos los das alimentos ricos en fibra como cereales y panes  [place], arroz, frijoles y frutas frescas y verduras. Estos alimentos pueden ayudarla a prevenir o Cytogeneticist.  Reanude las actividades como subir escaleras, conducir automviles, levantar objetos pesados, hacer ejercicios o viajar cuando le indique su mdico.  Hable con su mdico acerca de reanudar la actividad sexual. Volver a la actividad sexual depende del riesgo de infeccin, la velocidad de la curacin y la comodidad y su deseo de Financial controller.  Trate de que alguien la ayude con las actividades del hogar y con el recin nacido al menos durante algunos das despus de salir del hospital.  Descanse todo lo que pueda. Trate de descansar o tomar una siesta mientras el beb est durmiendo.  Aumente sus actividades gradualmente.  Cumpla con todos los controles programados para despus del [place]. Es muy importante asistir a todas las visitas de Nurse, adult. En estas visitas, su mdico va a controlarla para asegurarse de que est sanando fsica y emocionalmente. SOLICITE ATENCIN MDICA SI:   Elimina cogulos grandes por la vagina. Guarde algunos cogulos para mostrarle al mdico.  Tiene una secrecin con feo olor que proviene de la vagina.  Tiene dificultad para orinar.  Orina con frecuencia.  Siente dolor al [employer].  Nota un cambio en sus movimientos intestinales.  Aumenta el enrojecimiento, el dolor o la hinchazn en la zona de la incisin.  Observa que supura pus en la incisin.  La incisin se abre.  Sus [employer] duelen, estn duras o enrojecidas.  Sufre un dolor intenso de [place].  Tiene visin borrosa o ve manchas.  Se siente triste o deprimida.  Tiene pensamientos acerca de lastimarse o daar al  recién nacido. °· Tiene preguntas acerca de su cuidado, la atención del recién nacido o acerca de los medicamentos. °· Se siente mareada o sufre un desmayo. °· Tiene una erupción. °· Siente dolor u observa enrojecimiento o hinchazón en el sitio en que  estaba la vía intravenosa (IV). °· Tiene náuseas o vómitos. °· Usted dejó de amamantar al bebé y no ha tenido su período menstrual dentro de las 12 semanas siguientes. °· No amamanta al bebé y no tuvo su período menstrual en las últimas 12 semanas. °· Tiene fiebre. °SOLICITE ATENCIÓN MÉDICA DE INMEDIATO SI:  °· Siente dolor persistente. °· Siente dolor en el pecho. °· Le falta el aire. °· Se desmaya. °· Siente dolor en la pierna. °· Siente dolor en el estómago. °· El sangrado vaginal satura dos o más apósitos en 1 hora. °ASEGÚRESE DE QUE:  °· Comprende estas instrucciones. °· Controlará su enfermedad. °· Recibirá ayuda de inmediato si no mejora o si empeora. °Document Released: 10/10/2005 Document Revised: 02/24/2014 °ExitCare® Patient Information ©2015 [employer]. This information is not intended to replace advice given to you by your health care provider. Make sure you discuss any questions you have with your health care provider. ° °

## 2014-10-04 NOTE — Lactation Note (Signed)
This note was copied from the chart of Amber Warren. Lactation Consultation Note  Patient Name: Amber Nadeen LandauOfelia Warren HQION'GToday's Date: 10/04/2014   Follow-up visit at 49 hrs.  Since LC visit last night and eduction given, mom has been breastfeeding with each feeding in addition to post formula feeding.  Infant has breastfed x3 (10 min) + x2 attempts (5 min) + formula via bottle x10 (5-29 ml) in past 24 hrs; voids -8 in 24 hrs/ 11 life; stools -4 in 24 hrs/ 8 life; weight loss only 4% (WNL).  Mom has history of PCOS.  Spoke with mom via interpreter.  Mom does not have a pump at home; hand pump given for home use; demonstrated and explained how to use.  Engorgement prevention discussed.  Encouraged to continue offering breastfeeding first prior to giving formula.  Day of Life Supplementation guideline was given last night and reminded mom to use guideline but as her milk volume increases and baby consumes more breastmilk she can decrease the amount of formula being given.  Reviewed supply & demand.  Reviewed milk storage and questions about heating milk for use asked by mom.  Encouraged to continue breastfeeding with feeding cues. Mom does not have WIC; informed of hospital support group.  Encouraged to call for questions if needed.      Consult Status  Complete    Lendon KaVann, Vickie Ponds Walker 10/04/2014, 1:49 PM

## 2014-10-04 NOTE — Progress Notes (Signed)
At 6:10 I assisted JessicaRN with questions, By Orlan LeavensViria Alvarez, Interpreter

## 2014-10-04 NOTE — Discharge Summary (Signed)
Obstetric Discharge Summary Reason for Admission: onset of labor Prenatal Procedures: none Intrapartum Procedures: cesarean: low cervical, transverse Postpartum Procedures: none Complications-Operative and Postpartum: Anemia- hemodynamically stable HEMOGLOBIN  Date Value Ref Range Status  10/03/2014 8.2* 12.0 - 15.0 g/dL Final    Comment:    DELTA CHECK NOTED REPEATED TO VERIFY    HCT  Date Value Ref Range Status  10/03/2014 23.8* 36.0 - 46.0 % Final   [name] is a 23yo G1 admitted at 39.2wks in active labor on the evening of 12/9. During the night she received an epidural and Pitocin for augmentation. Her membranes ruptured spontaneously in the night and an IUPC was inserted in the morning as cx change had been minimal (5.5cm to 7cm with swelling). Later that morning when adequate ctx had been documented, she still did not progress and it was determined that a C/S was needed for delivery. By POD#2 she is doing well and is deemed to have received the full benefit of her hospital stay and will be discharged home. She is breastfeeding and is undecided on contraception.  Physical Exam:  General: alert, cooperative and mild distress  Heart: RRR Lungs: nl effort Lochia: appropriate Uterine Fundus: firm Incision: dsg dry/intact DVT Evaluation: No evidence of DVT seen on physical exam.  Discharge Diagnoses: Term Pregnancy-delivered and failure to progress in labor; Anemia  Discharge Information: Date: 10/04/2014 Activity: pelvic rest Diet: routine Medications: PNV, Ibuprofen, Iron and Percocet Condition: stable Instructions: refer to practice specific booklet Discharge to: home Follow-up Information    [facility] [place] Schedule an appointment as soon as possible for a visit in 6 weeks.   Why:  For your postpartum appointment.   Contact information:   [address] [place] [id number] [phone]      Newborn Data: Live  born female  Birth Weight: 8 lb 7.8 oz (3850 g) APGAR: 9, 9  Home with mother.  Cam HaiSHAW, Amber CNM 10/04/2014, 7:44 AM

## 2014-10-04 NOTE — Progress Notes (Signed)
I assisted MusicianJessica RN with some questions, by Orlan LeavensViria Alvarez, Interpreter

## 2014-10-05 NOTE — Progress Notes (Signed)
Assisted Peditrician with interpretation of discharge instructions for baby. Spanish Interpreter - Joselyn GlassmanBenita Sanchez

## 2014-10-05 NOTE — Progress Notes (Signed)
Assisted RN with interpretation of more discharge instructions. Spanish Interpreter - Joselyn GlassmanBenita Warren

## 2014-10-05 NOTE — Progress Notes (Signed)
Checked on patient and baby.   °Spanish Interpreter - Benita Sanchez °

## 2014-10-05 NOTE — Progress Notes (Signed)
Assisted RN with interpretation of discharge instructions  °Spanish Interpreter - Benita Sanchez °

## 2014-10-05 NOTE — Progress Notes (Signed)
Assisted [name] with lactation with interpretation of breast feeding instructions.  Also, assisted RN with interpretation of more discharge instructions.   Spanish Interpreter - Amber GlassmanBenita Warren

## 2014-10-29 ENCOUNTER — Encounter (HOSPITAL_COMMUNITY): Payer: Self-pay | Admitting: *Deleted

## 2014-10-29 ENCOUNTER — Inpatient Hospital Stay (HOSPITAL_COMMUNITY)
Admission: AD | Admit: 2014-10-29 | Discharge: 2014-10-29 | Disposition: A | Discharge status: Home / Self Care | Payer: MEDICAID | Source: Ambulatory Visit | Attending: Obstetrics & Gynecology | Admitting: Obstetrics & Gynecology

## 2014-10-29 DIAGNOSIS — O9089 Other complications of the puerperium, not elsewhere classified: Secondary | ICD-10-CM | POA: Insufficient documentation

## 2014-10-29 DIAGNOSIS — O909 Complication of the puerperium, unspecified: Secondary | ICD-10-CM

## 2014-10-29 DIAGNOSIS — R109 Unspecified abdominal pain: Secondary | ICD-10-CM | POA: Insufficient documentation

## 2014-10-29 DIAGNOSIS — G8918 Other acute postprocedural pain: Secondary | ICD-10-CM

## 2014-10-29 LAB — CBC WITH DIFFERENTIAL/PLATELET
BASOS PCT: 1 % (ref 0–1)
Basophils Absolute: 0.1 10*3/uL (ref 0.0–0.1)
Eosinophils Absolute: 0.9 10*3/uL — ABNORMAL HIGH (ref 0.0–0.7)
Eosinophils Relative: 9 % — ABNORMAL HIGH (ref 0–5)
HCT: 37 % (ref 36.0–46.0)
Hemoglobin: 12.4 g/dL (ref 12.0–15.0)
Lymphocytes Relative: 32 % (ref 12–46)
Lymphs Abs: 2.9 10*3/uL (ref 0.7–4.0)
MCH: 29.1 pg (ref 26.0–34.0)
MCHC: 33.5 g/dL (ref 30.0–36.0)
MCV: 86.9 fL (ref 78.0–100.0)
Monocytes Absolute: 0.7 10*3/uL (ref 0.1–1.0)
Monocytes Relative: 7 % (ref 3–12)
NEUTROS ABS: 4.8 10*3/uL (ref 1.7–7.7)
NEUTROS PCT: 51 % (ref 43–77)
Platelets: 328 10*3/uL (ref 150–400)
RBC: 4.26 MIL/uL (ref 3.87–5.11)
RDW: 13.9 % (ref 11.5–15.5)
WBC: 9.3 10*3/uL (ref 4.0–10.5)

## 2014-10-29 LAB — URINALYSIS, ROUTINE W REFLEX MICROSCOPIC
Bilirubin Urine: NEGATIVE
Glucose, UA: NEGATIVE mg/dL
Ketones, ur: 15 mg/dL — AB
Leukocytes, UA: NEGATIVE
NITRITE: NEGATIVE
PH: 5.5 (ref 5.0–8.0)
Protein, ur: NEGATIVE mg/dL
UROBILINOGEN UA: 0.2 mg/dL (ref 0.0–1.0)

## 2014-10-29 LAB — URINE MICROSCOPIC-ADD ON

## 2014-10-29 NOTE — MAU Note (Signed)
Had a c/s 12/10. Bleeding from incision for a wk. (did not call dr) . Last week felt hot at night, then started having sharp pains. Now is more painful.  Came in because the pain has gotten worse and is constant.  Does not have PP check up scheduled.

## 2014-10-29 NOTE — MAU Note (Signed)
Pt presented with abdominal pain and incisional drainage that started a week ago.

## 2014-10-29 NOTE — Discharge Instructions (Signed)
You were seen today for a healing cesarean incision. Your wound has no signs of infection today. It is important to keep the area clean and dry. You may use over the counter Ibuprofen as needed for pain. Please follow up in the clinic if there is no improvement in 1 week. Alivio del dolor antes y despus de la ciruga  (Pain Relief Preoperatively and Postoperatively) Ser un buen paciente no significa callarse la boca. Si tiene preguntas, problemas o preocupaciones Baker Hughes Incorporatedsobre el dolor que puede sentir despus de la ciruga, hgaselo saber a su mdico. Los pacientes tienen el derecho a la evaluacin y TEFL teachertratamiento del Engineer, miningdolor. El tratamiento del dolor despus de la ciruga es importante para que pueda acelerar la recuperacin y volver a sus actividades normales. El dolor intenso despus de la Azerba[place], y el miedo o la ansiedad asociada con el dolor pueden causar incomodidad extrema que:   Impide dormir.  Disminuye la capacidad de respirar profundamente y toser. Esto puede causar neumona u otras infecciones de las vas respiratorias superiores.  Hace que el corazn palpite ms rpido y la presin arterial sea ms alta.  Aumenta el riesgo de estreimiento y distensin abdominal.  Disminuye la capacidad de curacin de as heridas.  Puede resultar en depresin, aumento de la ansiedad y sentimientos de impotencia. El alivio del dolor antes de la ciruga tambin es importante porque va a disminuir el dolor despus de la Azerbaijanciruga. Los pacientes que reciben Washington Mutualalivio para el dolor tanto antes como despus de la Centerciruga, experimentan un mayor alivio del dolor que aquellos que slo reciben alivio despus de la [place]ijanciruga. Informe a su mdico si no puede Human resources officercontrolar el dolor. Esto es Intelmuy importante. El dolor despus de la ciruga es ms difcil de manejar si se deja que llegue a ser CBS Corporationmuy intenso, por lo tanto es necesario un tratamiento rpido y Armed forces training and education officeradecuado del dolor agudo.  MTODOS DE CONTROL DEL DOLOR  Los mdicos siguen  polticas y procedimientos sobre el manejo del dolor del Gosportpaciente. Antes de la ciruga deben explicarle estas directrices. Los planes para el control del dolor despus de la ciruga deben decidirse mutuamente y llevarse a cabo con su pleno conocimiento y consentimiento. No tenga miedo de hacer preguntas sobre la atencin que recibe. Hay muchas maneras diferentes en que los mdicos intentarn controlar el dolor, incluyendo los siguientes mtodos.  Control del dolor segn la necesidad  Podrn administrarle un medicamento para el dolor por va intravenosa (IV) o en forma de comprimido o lquido que deber tragar. Debe informar a su enfermero cuando siente siente dolor. Entonces le Public affairs consultantadministrarn el medicamento para el dolor que le han indicado.  El medicamento para el dolor puede causar constipacin. Si esto ocurre, en lo posible beba ms lquidos. El mdico podr indicarle que tome un laxante Circlesuave. Bomba para analgesia controlada por el paciente por va IV.   Puede recibir el medicamento para el dolor a travs de una va IV que se inserta en la vena. Usted puede controlar la cantidad de medicamento para el dolor que recibe. El medicamento fluye a travs de un tubo IV y es controlado a travs de una bomba. Al presionar un botn, esta bomba administra cierta cantidad de medicamento. Nadie debe presionar este botn, slo usted o alguien especficamente designado por usted para Media plannerhacerlo. Fue ideada para evitar que accidentalmente reciba demasiada cantidad de medicamento. Podr comenzar a Advertising copywriterutilizar la bomba en la sala de recuperacin despus de la ciruga. Este mtodo es til en la Gowandamayora de  las cirugas.  Si todava siente demasiado dolor, informe a su mdico. Adems, dgale si se siente muy somnoliento o mareado. Control epidural continuo para el dolor   Le insertarn en la espalda un tubo delgado y flexible (catter). El medicamento fluye a travs del catter para Engineer, materials en la zona del cuerpo en  donde se realiza la [place]. Funciona mejor si fue sometido a una [employer], abdomen, rea de la cadera o las piernas. El catter epidural se inserta en la espalda, justo antes de la [place]. Se deja hasta que pueda comer y tomar medicamentos por va oral. En la mayora de los Banks, esto ocurre [place] de [address] a [address]  [address].  La administracin del analgsico a travs del catter epidural puede ayudarle a sanar ms rpido debido a que:  El intestino recupera su ritmo normal.  Podr volver a comer ms pronto.  Puede estar de pie y caminar ms rpido. Medicamentos que Art therapist rea (anestesia local)   Es posible que le apliquen una inyeccin de un medicamento para el dolor cerca del lugar donde le duele (infiltracin local).  Podrn aplicarle la inyeccin cerca del nervio que controla la sensacin de una zona especfica del cuerpo (bloqueo nervioso perifrico).  Los medicamentos pueden ser inyectados en la columna vertebral para bloquear el dolor (bloqueoespinal). Opioides  El dolor agudo moderado a moderadamente intenso que se siente despus de Neomia Dear ciruga puede responder a los opioides. Los opioides son medicamentos narcticos para Primary school teacher. Generalmente se combinan con medicamentos no narcticos para aumentar el alivio del [address], disminuir el riesgo de efectos secundarios, y reducir la posibilidad de adiccin.  Si sigue las indicaciones de su mdico sobre como [employer] opioides y no tiene antecedentes de consumo de drogas, el riesgo de adiccin es excepcionalmente pequeo. Los opioides se administran durante perodos cortos en dosis precisas para evitar la adiccin. Otros mtodos de control del dolor son:   Corticoides.  Fisioterapia.  Terapia con calor y con fro.  Compresin, tales como colocar una venda elstica alrededor de la zona del dolor.  Masajes. Estas diversas formas de control del dolor pueden usarse juntas. La combinacin de diferentes mtodos de control del  dolor se denomina analgesia multimodal. El uso de este mtodo tiene muchas ventajas, incluyendo la posibilidad de Arts administrator, moverse y Gaffer del hospital anticipadamente.  Document Released: 09/29/2011 Document Revised: 01/02/2012 [facility] Patient Information 2015 [place], [place]. This information is not intended to replace advice given to you by your health care provider. Make sure you discuss any questions you have with your health care provider.

## 2014-10-29 NOTE — MAU Provider Note (Signed)
History     CSN: [id number]  Arrival date and time: 10/29/14 1738   First Provider Initiated Contact with Patient 10/29/14 1849      Chief Complaint  Patient presents with  . Post-op Problem   HPI 24 y.o. G1P1001 delivered via cesarean on 10/02/14. Pt reports pain and bleeding from incision site. Denies soaking bandages, reports drainage is constant. Pt denies n/v, fever/chills, diarrhea, constipation. Denies intercourse after delivery, denies heavy lifting. Pt reports no difficulty breast feeding or bonding. Reports only light vaginal spotting.  Patient states she has not taken anything for pain in over 24 hours.  . Past Medical History  Diagnosis Date  . Dysmenorrhea   . PCO (polycystic ovaries)   . Obesity (BMI 30-39.9)   . Anemia     Past Surgical History  Procedure Laterality Date  . No past surgeries    . Cesarean section N/A 10/02/2014    Procedure: CESAREAN SECTION;  Surgeon: Adam PhenixJames G Arnold, MD;  Location: [facility];  Service: Obstetrics;  Laterality: N/A;    History reviewed. No pertinent family history.  History  Substance Use Topics  . Smoking status: Never Smoker   . Smokeless tobacco: Never Used  . Alcohol Use: No    Allergies: No Known Allergies  Prescriptions prior to admission  Medication Sig Dispense Refill Last Dose  . ferrous sulfate (FERROUSUL) 325 (65 FE) MG tablet Take 1 tablet (325 mg total) by mouth 2 (two) times daily with a meal.  3   . ibuprofen (ADVIL,MOTRIN) 600 MG tablet Take 1 tablet (600 mg total) by mouth every 6 (six) hours. 50 tablet 1   . loratadine (CLARITIN) 10 MG tablet Take 10 mg by mouth daily.   09/30/2014 at Unknown time  . oxyCODONE-acetaminophen (PERCOCET/ROXICET) 5-325 MG per tablet Take 1-2 tablets by mouth every 4 (four) hours as needed (for pain scale less than 7). 50 tablet 0   . Prenatal Vit-Fe Fumarate-FA (PRENATAL MULTIVITAMIN) TABS tablet Take 1 tablet by mouth daily at 12 noon.   09/30/2014 at Unknown time   Results  for orders placed or performed during the hospital encounter of 10/29/14 (from the past 72 hour(s))  Urinalysis, Routine w reflex microscopic     Status: Abnormal   Collection Time: 10/29/14  6:10 PM  Result Value Ref Range   Color, Urine YELLOW YELLOW   APPearance CLEAR CLEAR   Specific Gravity, Urine >1.030 (H) 1.005 - 1.030   pH 5.5 5.0 - 8.0   Glucose, UA NEGATIVE NEGATIVE mg/dL   Hgb urine dipstick SMALL (A) NEGATIVE   Bilirubin Urine NEGATIVE NEGATIVE   Ketones, ur 15 (A) NEGATIVE mg/dL   Protein, ur NEGATIVE NEGATIVE mg/dL   Urobilinogen, UA 0.2 0.0 - 1.0 mg/dL   Nitrite NEGATIVE NEGATIVE   Leukocytes, UA NEGATIVE NEGATIVE  Urine microscopic-add on     Status: Abnormal   Collection Time: 10/29/14  6:10 PM  Result Value Ref Range   Squamous Epithelial / LPF FEW (A) RARE   RBC / HPF 0-2 <3 RBC/hpf  CBC with Differential     Status: Abnormal   Collection Time: 10/29/14  7:30 PM  Result Value Ref Range   WBC 9.3 4.0 - 10.5 K/uL   RBC 4.26 3.87 - 5.11 MIL/uL   Hemoglobin 12.4 12.0 - 15.0 g/dL   HCT 40.937.0 81.136.0 - 91.446.0 %   MCV 86.9 78.0 - 100.0 fL   MCH 29.1 26.0 - 34.0 pg   MCHC 33.5 30.0 - 36.0  g/dL   RDW 16.1 09.6 - 04.5 %   Platelets 328 150 - 400 K/uL   Neutrophils Relative % 51 43 - 77 %   Neutro Abs 4.8 1.7 - 7.7 K/uL   Lymphocytes Relative 32 12 - 46 %   Lymphs Abs 2.9 0.7 - 4.0 K/uL   Monocytes Relative 7 3 - 12 %   Monocytes Absolute 0.7 0.1 - 1.0 K/uL   Eosinophils Relative 9 (H) 0 - 5 %   Eosinophils Absolute 0.9 (H) 0.0 - 0.7 K/uL   Basophils Relative 1 0 - 1 %   Basophils Absolute 0.1 0.0 - 0.1 K/uL    Review of Systems  Constitutional: Negative for fever, chills, weight loss, malaise/fatigue and diaphoresis.  Respiratory: Negative for shortness of breath.   Cardiovascular: Negative for chest pain.  Gastrointestinal: Positive for abdominal pain. Negative for heartburn, nausea, vomiting, diarrhea, constipation, blood in stool and melena.  Genitourinary:  Negative for dysuria, urgency, frequency, hematuria and flank pain.  Skin: Negative for itching and rash.  Neurological: Negative for weakness.   Physical Exam   Blood pressure 131/77, pulse 62, temperature 98.1 F (36.7 C), temperature source Oral, resp. rate 18, last menstrual period 12/01/2013, currently breastfeeding.  Physical Exam  Constitutional: She is oriented to person, place, and time. She appears well-developed and well-nourished.  Respiratory: Effort normal and breath sounds normal. No respiratory distress.  GI: Soft. Bowel sounds are normal. She exhibits no distension and no mass. There is tenderness. There is no rebound and no guarding.  Tenderness at cesarean scar  Neurological: She is alert and oriented to person, place, and time.  Skin: Skin is warm and dry. No rash noted. No erythema. No pallor.  Edges approximated and epithelialized on cesarean scar. Two areas of separation, one 0.2cm midline and 2nd area 0.3cm left side. Left side has scant amount of serous drainage. Midline draining small amount of serosanguinous drainage. No induration, inflammation, warmth, purulent drainage or area of firmness. Minimal pain with palpation of incision.  Psychiatric: She has a normal mood and affect. Her behavior is normal. Thought content normal.    MAU Course  Procedures Results for orders placed or performed during the hospital encounter of 10/29/14 (from the past 24 hour(s))  Urinalysis, Routine w reflex microscopic     Status: Abnormal   Collection Time: 10/29/14  6:10 PM  Result Value Ref Range   Color, Urine YELLOW YELLOW   APPearance CLEAR CLEAR   Specific Gravity, Urine >1.030 (H) 1.005 - 1.030   pH 5.5 5.0 - 8.0   Glucose, UA NEGATIVE NEGATIVE mg/dL   Hgb urine dipstick SMALL (A) NEGATIVE   Bilirubin Urine NEGATIVE NEGATIVE   Ketones, ur 15 (A) NEGATIVE mg/dL   Protein, ur NEGATIVE NEGATIVE mg/dL   Urobilinogen, UA 0.2 0.0 - 1.0 mg/dL   Nitrite NEGATIVE NEGATIVE    Leukocytes, UA NEGATIVE NEGATIVE  Urine microscopic-add on     Status: Abnormal   Collection Time: 10/29/14  6:10 PM  Result Value Ref Range   Squamous Epithelial / LPF FEW (A) RARE   RBC / HPF 0-2 <3 RBC/hpf  CBC with Differential     Status: Abnormal   Collection Time: 10/29/14  7:30 PM  Result Value Ref Range   WBC 9.3 4.0 - 10.5 K/uL   RBC 4.26 3.87 - 5.11 MIL/uL   Hemoglobin 12.4 12.0 - 15.0 g/dL   HCT 40.9 81.1 - 91.4 %   MCV 86.9 78.0 - 100.0 fL  MCH 29.1 26.0 - 34.0 pg   MCHC 33.5 30.0 - 36.0 g/dL   RDW 16.1 09.6 - 04.5 %   Platelets 328 150 - 400 K/uL   Neutrophils Relative % 51 43 - 77 %   Neutro Abs 4.8 1.7 - 7.7 K/uL   Lymphocytes Relative 32 12 - 46 %   Lymphs Abs 2.9 0.7 - 4.0 K/uL   Monocytes Relative 7 3 - 12 %   Monocytes Absolute 0.7 0.1 - 1.0 K/uL   Eosinophils Relative 9 (H) 0 - 5 %   Eosinophils Absolute 0.9 (H) 0.0 - 0.7 K/uL   Basophils Relative 1 0 - 1 %   Basophils Absolute 0.1 0.0 - 0.1 K/uL    MDM Discussed patient with MD Eure, CBC w/ diff eval infection.   Assessment and Plan  A: Pain at Surgical Site with drainage  P: Discharge Home Educated on wound care, advised to keep skin dry, avoid heavy lifting Return if experiences purulent drainage, fever/chills, increased pain or separating edges at incision site. F/U with [facility]/ HD  if no improvement in 1 week  Evaluation and management procedures were performed by the NP student under my supervision and collaboration. I have reviewed the note and chart, and I agree with the management and plan.  Iona Hansen Lizandra Zakrzewski, NP 10/31/2014 8:42 PM

## 2016-11-02 ENCOUNTER — Encounter (HOSPITAL_COMMUNITY): Payer: Self-pay | Admitting: *Deleted

## 2016-11-02 ENCOUNTER — Emergency Department (HOSPITAL_COMMUNITY): Payer: Self-pay

## 2016-11-02 ENCOUNTER — Emergency Department (HOSPITAL_COMMUNITY)
Admission: EM | Admit: 2016-11-02 | Discharge: 2016-11-02 | Disposition: A | Discharge status: Home / Self Care | Payer: Self-pay | Attending: Emergency Medicine | Admitting: Emergency Medicine

## 2016-11-02 DIAGNOSIS — R1011 Right upper quadrant pain: Secondary | ICD-10-CM

## 2016-11-02 LAB — CBC
HCT: 40 % (ref 36.0–46.0)
Hemoglobin: 13.9 g/dL (ref 12.0–15.0)
MCH: 30 pg (ref 26.0–34.0)
MCHC: 34.8 g/dL (ref 30.0–36.0)
MCV: 86.4 fL (ref 78.0–100.0)
Platelets: 280 10*3/uL (ref 150–400)
RBC: 4.63 MIL/uL (ref 3.87–5.11)
RDW: 13.3 % (ref 11.5–15.5)
WBC: 9.7 10*3/uL (ref 4.0–10.5)

## 2016-11-02 LAB — I-STAT BETA HCG BLOOD, ED (MC, WL, AP ONLY): I-stat hCG, quantitative: <5 m[IU]/mL (ref <5)

## 2016-11-02 LAB — COMPREHENSIVE METABOLIC PANEL
ALT: 36 U/L (ref 14–54)
AST: 27 U/L (ref 15–41)
Albumin: 3.7 g/dL (ref 3.5–5.0)
Alkaline Phosphatase: 83 U/L (ref 38–126)
Anion gap: 8 (ref 5–15)
BUN: 14 mg/dL (ref 6–20)
CHLORIDE: 108 mmol/L (ref 101–111)
CO2: 21 mmol/L — AB (ref 22–32)
CREATININE: 0.52 mg/dL (ref 0.44–1.00)
Calcium: 9 mg/dL (ref 8.9–10.3)
GFR calc Af Amer: >60 mL/min (ref >60)
GFR calc non Af Amer: >60 mL/min (ref >60)
Glucose, Bld: 117 mg/dL — ABNORMAL HIGH (ref 65–99)
Potassium: 3.8 mmol/L (ref 3.5–5.1)
Sodium: 137 mmol/L (ref 135–145)
Total Bilirubin: 0.2 mg/dL — ABNORMAL LOW (ref 0.3–1.2)
Total Protein: 6.5 g/dL (ref 6.5–8.1)

## 2016-11-02 LAB — URINALYSIS, ROUTINE W REFLEX MICROSCOPIC
Bilirubin Urine: NEGATIVE
GLUCOSE, UA: NEGATIVE mg/dL
HGB URINE DIPSTICK: NEGATIVE
KETONES UR: NEGATIVE mg/dL
Leukocytes, UA: NEGATIVE
Nitrite: NEGATIVE
Protein, ur: NEGATIVE mg/dL
Specific Gravity, Urine: 1.038 — ABNORMAL HIGH (ref 1.005–1.030)
pH: 5 (ref 5.0–8.0)

## 2016-11-02 LAB — LIPASE, BLOOD: Lipase: 28 U/L (ref 11–51)

## 2016-11-02 MED ORDER — ONDANSETRON HCL 4 MG/2ML IJ SOLN
4.0000 mg | Freq: Once | INTRAMUSCULAR | Status: AC
  Administered 2016-11-02: 4 mg via INTRAVENOUS
  Filled 2016-11-02: qty 2

## 2016-11-02 MED ORDER — MORPHINE SULFATE (PF) 4 MG/ML IV SOLN
4.0000 mg | Freq: Once | INTRAVENOUS | Status: AC
  Administered 2016-11-02: 4 mg via INTRAVENOUS
  Filled 2016-11-02: qty 1

## 2016-11-02 MED ORDER — ONDANSETRON HCL 4 MG PO TABS: 4.0000 mg | ORAL_TABLET | Freq: Three times a day (TID) | ORAL | 0 refills | Status: DC | PRN

## 2016-11-02 MED ORDER — SODIUM CHLORIDE 0.9 % IV BOLUS (SEPSIS)
1000.0000 mL | Freq: Once | INTRAVENOUS | Status: AC
  Administered 2016-11-02: 1000 mL via INTRAVENOUS

## 2016-11-02 MED ORDER — PANTOPRAZOLE SODIUM 20 MG PO TBEC: 20.0000 mg | DELAYED_RELEASE_TABLET | Freq: Every day | ORAL | 0 refills | Status: DC

## 2016-11-02 MED ORDER — HYDROCODONE-ACETAMINOPHEN 5-325 MG PO TABS: 1.0000 | ORAL_TABLET | ORAL | 0 refills | Status: DC | PRN

## 2016-11-02 NOTE — ED Notes (Signed)
Patient at u/s 

## 2016-11-02 NOTE — ED Provider Notes (Signed)
Signed out from Dr. Wilkie AyeHorton. Labs/ultrasound are normal. Patient's pain has completely resolved. Vital signs are stable. Patient states pain is induced after eating fatty and fried foods. Radiates from the right upper quadrant to the right flank. Associated with nausea and vomiting. Suspect biliary colic. No stones visualized. May need HIDA scan. Patient also is taking frequent ibuprofen. Symptoms may be related to peptic ulcer disease/gastritis. Will start on PPI and we'll refer to gastroenterology. Patient understands the need to avoid fried or fatty food. General she stands need to return immediately for worsening pain, fever, blood in vomit or stool or for any concerns. Communicated through interpreter. All questions were answered.   Amber Raceravid Brooks Kinnan, MD 11/02/16 [phone]

## 2016-11-02 NOTE — ED Provider Notes (Signed)
[facility]-EMER[facility]ENCY DEPT Provider Note   CSN: [id number] Arrival date & time: 11/02/16  0204     History   Chief Complaint Chief Complaint  Patient presents with  . Abdominal Pain    HPI Amber Warren is a 26 y.o. female.  HPI  This is a 26 year old female who presents with right upper quadrant pain. Patient reports that she has had pain for years. However, over the last 2-3 weeks she has had worsening pain. It is worse with eating. She reports nausea without vomiting or diarrhea. Currently her pain is 7 out of 10. It is in her right upper quadrant. It is nonradiating. She denies any fevers or urinary symptoms. She has not taken any medications for her pain.  Past Medical History:  Diagnosis Date  . Anemia   . Dysmenorrhea   . Obesity (BMI 30-39.9)   . PCO (polycystic ovaries)     Patient Active Problem List   Diagnosis Date Noted  . Threatened labor at term 10/01/2014  . Pregnant 02/19/2014  . Overweight(278.02) 03/29/2012  . PCO (polycystic ovaries) 05/24/2011  . Oligomenorrhea 05/24/2011    Past Surgical History:  Procedure Laterality Date  . CESAREAN SECTION N/A 10/02/2014   Procedure: CESAREAN SECTION;  Surgeon: Adam PhenixJames G Arnold, MD;  Location: [facility];  Service: Obstetrics;  Laterality: N/A;  . NO PAST SURGERIES      OB History    Gravida Para Term Preterm AB Living   1 1 1  0 0 1   SAB TAB Ectopic Multiple Live Births   0 0 0 0 1       Home Medications    Prior to Admission medications   Medication Sig Start Date End Date Taking? Authorizing Provider  ibuprofen (ADVIL,MOTRIN) 600 MG tablet Take 1 tablet (600 mg total) by mouth every 6 (six) hours. Patient taking differently: Take 800 mg by mouth every 6 (six) hours as needed for mild pain.  10/04/14  Yes Arabella MerlesKimberly D Shaw, CNM    Family History No family history on file.  Social History Social History  Substance Use Topics  . Smoking status: Never Smoker  . Smokeless tobacco: Never Used  .  Alcohol use No     Allergies   Patient has no known allergies.   Review of Systems Review of Systems  Constitutional: Negative for fever.  Respiratory: Negative for shortness of breath.   Cardiovascular: Negative for chest pain.  Gastrointestinal: Positive for abdominal pain and nausea. Negative for diarrhea and vomiting.  Genitourinary: Negative for dysuria and hematuria.  All other systems reviewed and are negative.    Physical Exam Updated Vital Signs BP 135/91   Pulse 77   Temp 99.1 F (37.3 C) (Oral)   Resp 18   SpO2 100%   Physical Exam  Constitutional: She is oriented to person, place, and time. No distress.  Obese  HENT:  Head: Normocephalic and atraumatic.  Cardiovascular: Normal rate, regular rhythm and normal heart sounds.   Pulmonary/Chest: Effort normal and breath sounds normal. No respiratory distress. She has no wheezes.  Abdominal: Soft. Bowel sounds are normal. There is tenderness. There is no guarding.  Right upper quadrant tenderness to palpation without rebound or guarding  Neurological: She is alert and oriented to person, place, and time.  Skin: Skin is warm and dry.  Psychiatric: She has a normal mood and affect.  Nursing note and vitals reviewed.    ED Treatments / Results  Labs (all labs ordered are listed, but only  abnormal results are displayed) Labs Reviewed  COMPREHENSIVE METABOLIC PANEL - Abnormal; Notable for the following:       Result Value   CO2 21 (*)    Glucose, Bld 117 (*)    Total Bilirubin 0.2 (*)    All other components within normal limits  URINALYSIS, ROUTINE W REFLEX MICROSCOPIC - Abnormal; Notable for the following:    APPearance HAZY (*)    Specific Gravity, Urine 1.038 (*)    All other components within normal limits  LIPASE, BLOOD  CBC  I-STAT BETA HCG BLOOD, ED ([facility], [facility], [facility] ONLY)    EKG  EKG Interpretation None       Radiology No results found.  Procedures Procedures (including critical care  time)  Medications Ordered in ED Medications  morphine 4 MG/ML injection 4 mg (not administered)  ondansetron (ZOFRAN) injection 4 mg (not administered)  sodium chloride 0.9 % bolus 1,000 mL (not administered)     Initial Impression / Assessment and Plan / ED Course  I have reviewed the triage vital signs and the nursing notes.  Pertinent labs & imaging results that were available during my care of the patient were reviewed by me and considered in my medical decision making (see chart for details).  Clinical Course     Patient presents with right upper quadrant pain. Worse with eating. Ongoing and waxing and waning over several years but worsening over the last several weeks. Nontoxic. No signs of peritonitis. Pancreatitis or gallbladder disease high on differential. Lipase is normal. LFTs reassuring. Patient given pain and nausea medication. Right upper quadrant ultrasound obtained.  Final Clinical Impressions(s) / ED Diagnoses   Final diagnoses:  RUQ pain    New Prescriptions New Prescriptions   No medications on file     Shon Baton, MD 11/02/16 [phone]

## 2016-11-02 NOTE — ED Triage Notes (Signed)
The pt is c/o rt abd pain for years  Off and on she speaks little english  Used the interpreter  No n v  lmp none she has an implant

## 2016-11-18 ENCOUNTER — Other Ambulatory Visit (HOSPITAL_COMMUNITY): Payer: Self-pay | Admitting: Surgery

## 2016-11-18 DIAGNOSIS — R1011 Right upper quadrant pain: Secondary | ICD-10-CM

## 2016-11-25 ENCOUNTER — Encounter (HOSPITAL_COMMUNITY): Admission: RE | Admit: 2016-11-25 | Payer: Self-pay | Source: Ambulatory Visit

## 2016-12-09 ENCOUNTER — Encounter (HOSPITAL_COMMUNITY)
Admission: RE | Admit: 2016-12-09 | Discharge: 2016-12-09 | Disposition: A | Discharge status: Home / Self Care | Payer: Self-pay | Source: Ambulatory Visit | Attending: Surgery | Admitting: Surgery

## 2016-12-09 DIAGNOSIS — R1011 Right upper quadrant pain: Secondary | ICD-10-CM | POA: Insufficient documentation

## 2016-12-09 MED ORDER — SINCALIDE 5 MCG IJ SOLR
0.0200 ug/kg | Freq: Once | INTRAMUSCULAR | Status: AC
  Administered 2016-12-09: 1.7 ug via INTRAVENOUS

## 2016-12-09 MED ORDER — TECHNETIUM TC 99M MEBROFENIN IV KIT
5.5000 | PACK | Freq: Once | INTRAVENOUS | Status: AC | PRN
  Administered 2016-12-09: 5.5 via INTRAVENOUS

## 2017-03-08 ENCOUNTER — Encounter: Payer: Self-pay | Admitting: Gynecology

## 2017-05-11 IMAGING — US US ABDOMEN LIMITED
1 series · 14 of 25 positions shown · non-contrast
Comparison: None.

CLINICAL DATA: Right upper quadrant pain

EXAM:
US ABDOMEN LIMITED - RIGHT UPPER QUADRANT

[Series 1: us abdomen limited · 0.26mm/px · 14 of 33 slices shown]
[im 1/33]
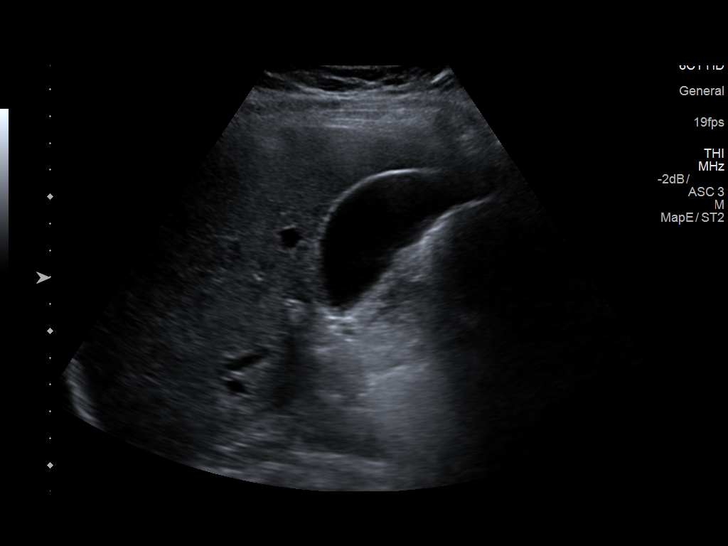
[im 3/33]
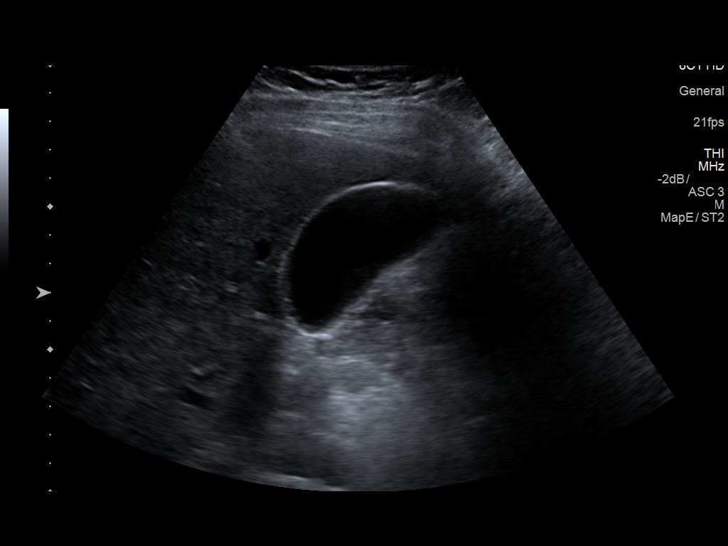
[im 6/33]
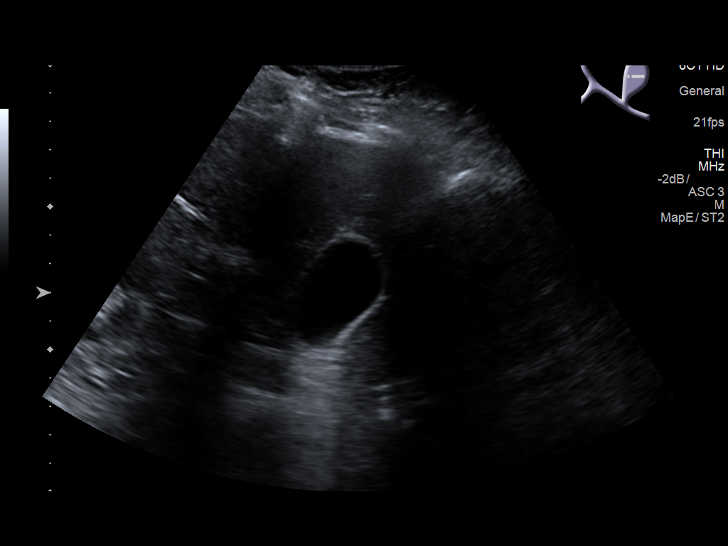
[im 9/33]
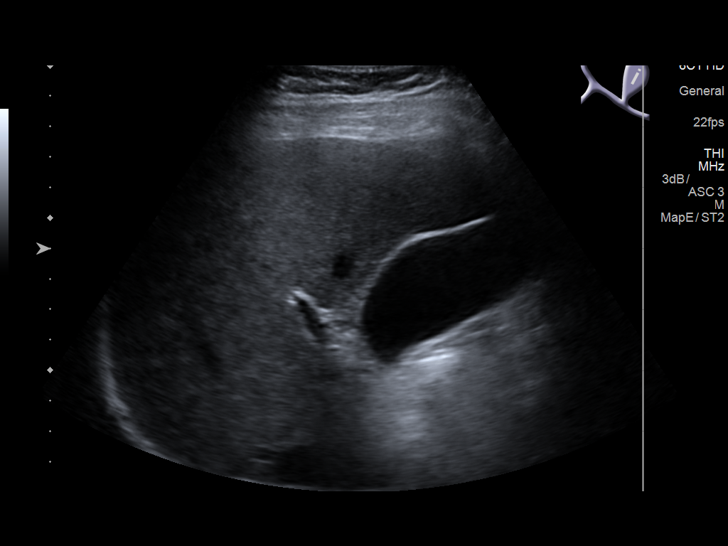
[im 11/33]
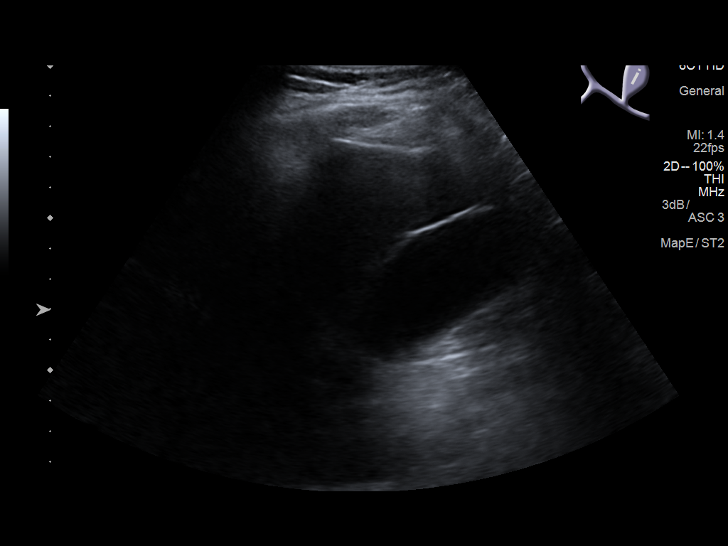
[im 13/33]
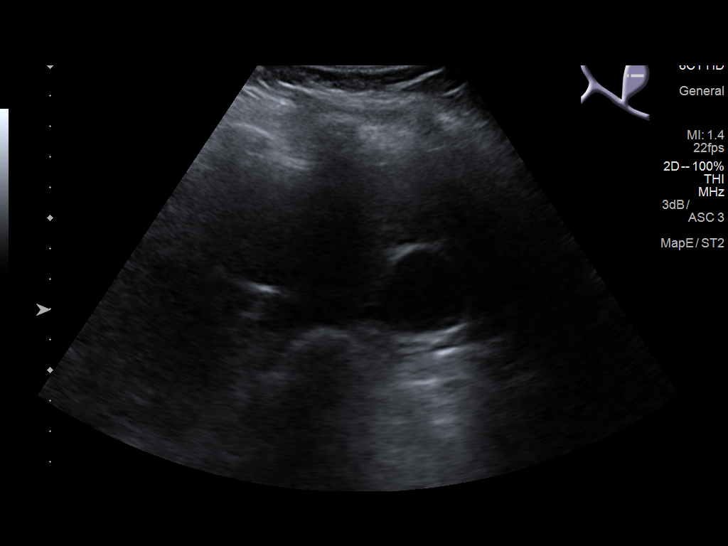
[im 15/33]
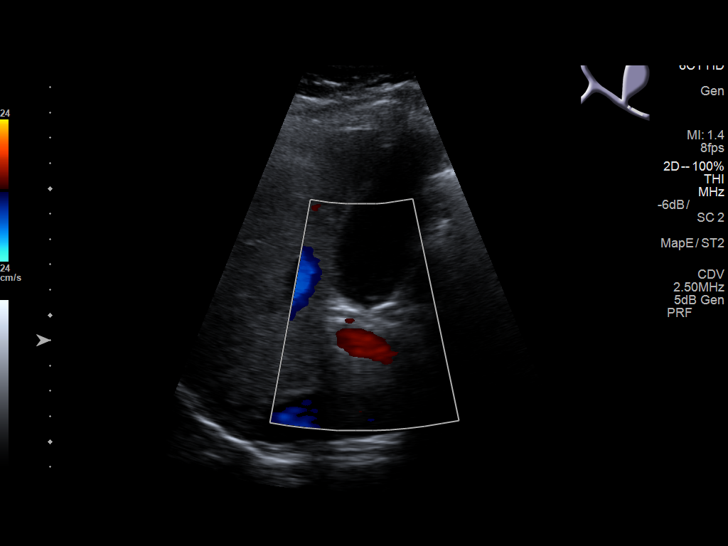
[im 18/33]
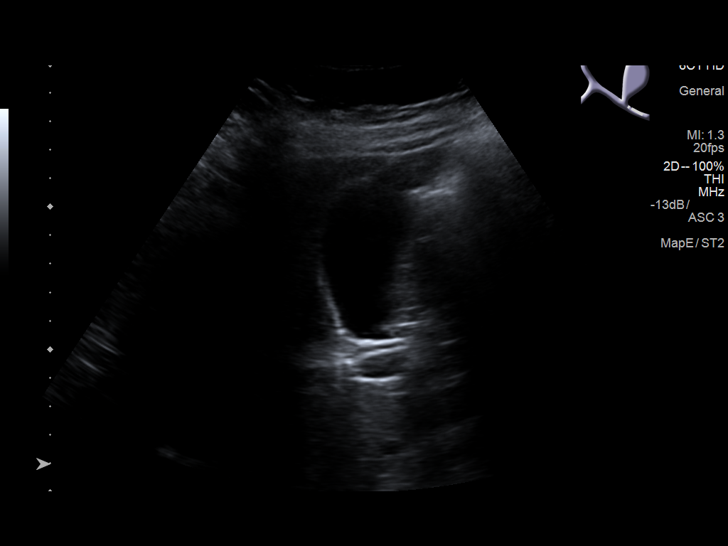
[im 21/33]
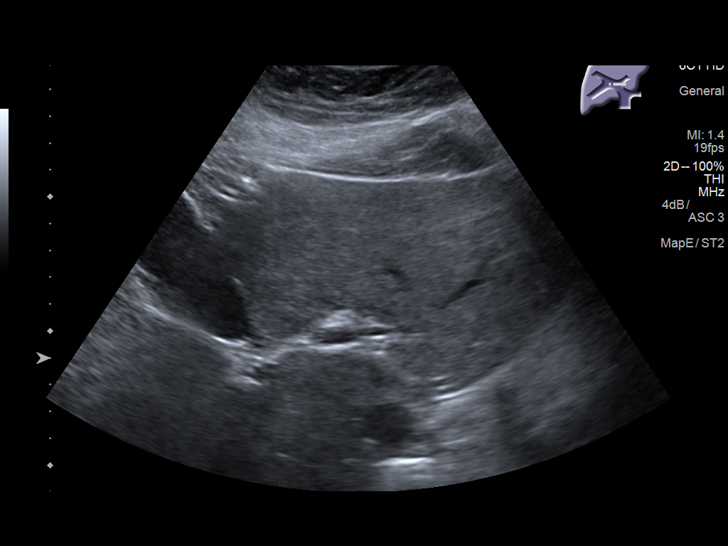
[im 22/33]
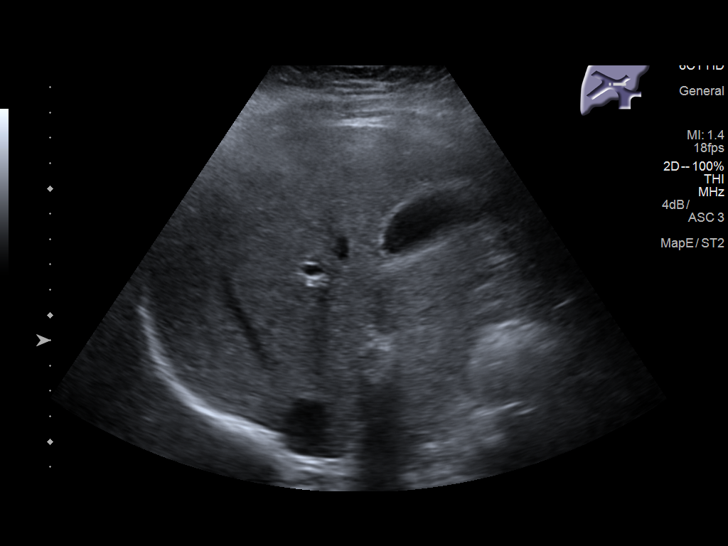
[im 25/33]
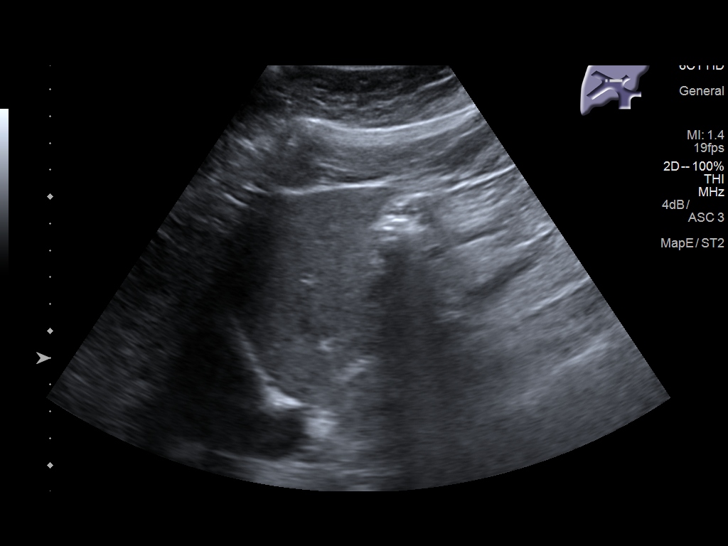
[im 27/33]
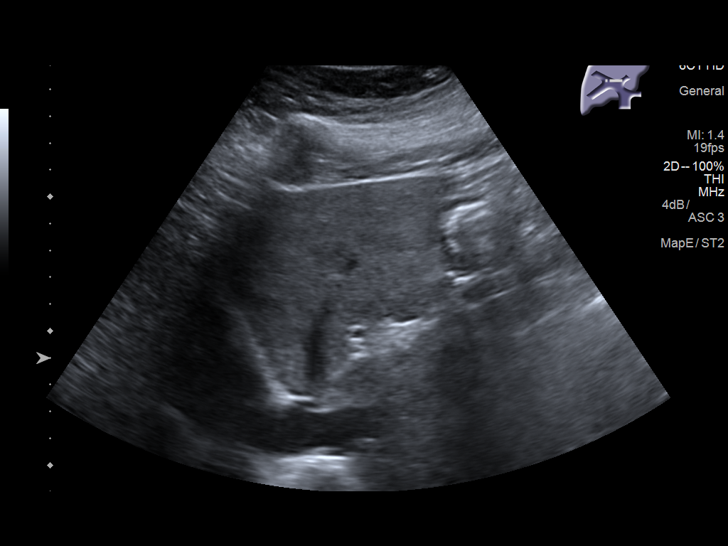
[im 30/33]
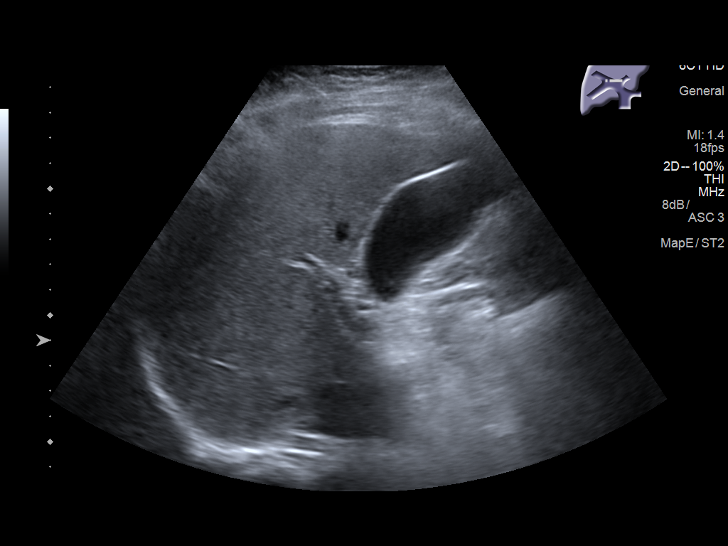
[im 33/33]
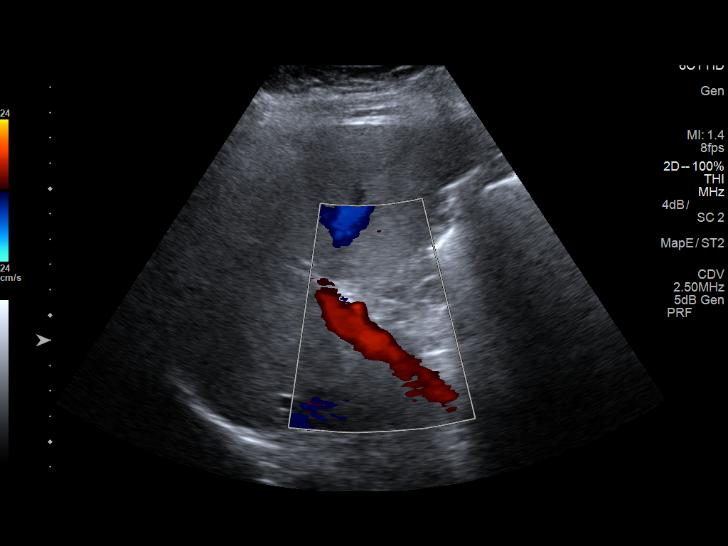

[14 of 25 positions shown; findings below may reference images not displayed]

FINDINGS: Gallbladder:

No gallstones or wall thickening visualized. No sonographic Murphy
sign noted by sonographer.

Common bile duct:

Diameter: 2.3 mm

Liver:

No focal lesion identified. Within normal limits in parenchymal
echogenicity.
IMPRESSION: Negative right upper quadrant ultrasound

## 2017-07-08 ENCOUNTER — Encounter (HOSPITAL_COMMUNITY): Payer: Self-pay | Admitting: *Deleted

## 2017-07-08 ENCOUNTER — Inpatient Hospital Stay (HOSPITAL_COMMUNITY)
Admission: AD | Admit: 2017-07-08 | Discharge: 2017-07-08 | Disposition: A | Discharge status: Home / Self Care | Payer: Self-pay | Source: Ambulatory Visit | Attending: Obstetrics and Gynecology | Admitting: Obstetrics and Gynecology

## 2017-07-08 ENCOUNTER — Inpatient Hospital Stay (HOSPITAL_COMMUNITY): Payer: Self-pay

## 2017-07-08 DIAGNOSIS — O26891 Other specified pregnancy related conditions, first trimester: Secondary | ICD-10-CM | POA: Insufficient documentation

## 2017-07-08 DIAGNOSIS — R109 Unspecified abdominal pain: Secondary | ICD-10-CM | POA: Insufficient documentation

## 2017-07-08 DIAGNOSIS — Z9889 Other specified postprocedural states: Secondary | ICD-10-CM | POA: Insufficient documentation

## 2017-07-08 DIAGNOSIS — E669 Obesity, unspecified: Secondary | ICD-10-CM | POA: Insufficient documentation

## 2017-07-08 DIAGNOSIS — O26899 Other specified pregnancy related conditions, unspecified trimester: Secondary | ICD-10-CM

## 2017-07-08 DIAGNOSIS — Z3201 Encounter for pregnancy test, result positive: Secondary | ICD-10-CM | POA: Insufficient documentation

## 2017-07-08 DIAGNOSIS — N831 Corpus luteum cyst of ovary, unspecified side: Secondary | ICD-10-CM | POA: Insufficient documentation

## 2017-07-08 DIAGNOSIS — O3481 Maternal care for other abnormalities of pelvic organs, first trimester: Secondary | ICD-10-CM | POA: Insufficient documentation

## 2017-07-08 DIAGNOSIS — O99212 Obesity complicating pregnancy, second trimester: Secondary | ICD-10-CM | POA: Insufficient documentation

## 2017-07-08 DIAGNOSIS — Z3A01 Less than 8 weeks gestation of pregnancy: Secondary | ICD-10-CM | POA: Insufficient documentation

## 2017-07-08 LAB — URINALYSIS, ROUTINE W REFLEX MICROSCOPIC
Bilirubin Urine: NEGATIVE
Glucose, UA: NEGATIVE mg/dL
Hgb urine dipstick: NEGATIVE
Ketones, ur: NEGATIVE mg/dL
Leukocytes, UA: NEGATIVE
Nitrite: NEGATIVE
Protein, ur: NEGATIVE mg/dL
Specific Gravity, Urine: 1.025 (ref 1.005–1.030)
pH: 5 (ref 5.0–8.0)

## 2017-07-08 LAB — CBC WITH DIFFERENTIAL/PLATELET
Basophils Absolute: 0 10*3/uL (ref 0.0–0.1)
Basophils Relative: 0 %
EOS ABS: 0.1 10*3/uL (ref 0.0–0.7)
EOS PCT: 1 %
HCT: 37.4 % (ref 36.0–46.0)
HEMOGLOBIN: 12.8 g/dL (ref 12.0–15.0)
LYMPHS ABS: 2.9 10*3/uL (ref 0.7–4.0)
Lymphocytes Relative: 25 %
MCH: 29.6 pg (ref 26.0–34.0)
MCHC: 34.2 g/dL (ref 30.0–36.0)
MCV: 86.6 fL (ref 78.0–100.0)
MONOS PCT: 4 %
Monocytes Absolute: 0.5 10*3/uL (ref 0.1–1.0)
NEUTROS PCT: 70 %
Neutro Abs: 8.3 10*3/uL — ABNORMAL HIGH (ref 1.7–7.7)
Platelets: 292 10*3/uL (ref 150–400)
RBC: 4.32 MIL/uL (ref 3.87–5.11)
RDW: 13.8 % (ref 11.5–15.5)
WBC: 11.8 10*3/uL — ABNORMAL HIGH (ref 4.0–10.5)

## 2017-07-08 LAB — HCG, QUANTITATIVE, PREGNANCY: HCG, BETA CHAIN, QUANT, S: 9578 m[IU]/mL — AB (ref <5)

## 2017-07-08 LAB — WET PREP, GENITAL
Clue Cells Wet Prep HPF POC: NONE SEEN
SPERM: NONE SEEN
Trich, Wet Prep: NONE SEEN
WBC, Wet Prep HPF POC: NONE SEEN
Yeast Wet Prep HPF POC: NONE SEEN

## 2017-07-08 LAB — HIV ANTIBODY (ROUTINE TESTING W REFLEX): HIV SCREEN 4TH GENERATION: NONREACTIVE

## 2017-07-08 LAB — POCT PREGNANCY, URINE: Preg Test, Ur: POSITIVE — AB

## 2017-07-08 NOTE — MAU Note (Signed)
Pt states she was taking pills to loose wt and did not stop med until end of August. Concerned they could have hurt the baby.

## 2017-07-08 NOTE — MAU Provider Note (Signed)
History     CSN: [id number]  Arrival date and time: 07/08/17 7829   First Provider Initiated Contact with Patient 07/08/17 0113      Chief Complaint  Patient presents with  . Abdominal Pain   HPI [name] is a 26 y.o. G1P1001 at [redacted]w[redacted]d who presents to MAU today with complaint of lower abdominal pain x 1 week, but worse since yesterday. She denies vaginal bleeding, N/V/D, fever, vaginal discharge or UTI symptoms.   OB History    Gravida Para Term Preterm AB Living   0 0 1   SAB TAB Ectopic Multiple Live Births   0 0 0 0 1      Past Medical History:  Diagnosis Date  . Anemia   . Dysmenorrhea   . Obesity (BMI 30-39.9)   . PCO (polycystic ovaries)     Past Surgical History:  Procedure Laterality Date  . CESAREAN SECTION N/A 10/02/2014   Procedure: CESAREAN SECTION;  Surgeon: Adam Phenix, MD;  Location: [facility];  Service: Obstetrics;  Laterality: N/A;  . NO PAST SURGERIES      History reviewed. No pertinent family history.  Social History  Substance Use Topics  . Smoking status: Never Smoker  . Smokeless tobacco: Never Used  . Alcohol use No    Allergies: No Known Allergies  No prescriptions prior to admission.    Review of Systems  Constitutional: Negative for fever.  Gastrointestinal: Positive for abdominal pain. Negative for constipation, diarrhea, nausea and vomiting.  Genitourinary: Negative for dysuria, frequency, urgency, vaginal bleeding and vaginal discharge.   Physical Exam   Blood pressure 110/64, pulse 66, temperature 98.2 F (36.8 C), resp. rate 18, height 4' 9.25" (1.454 m), weight 158 lb (71.7 kg), last menstrual period 04/30/2017, currently breastfeeding.  Physical Exam  Nursing note and vitals reviewed. Constitutional: She is oriented to person, place, and time. She appears well-developed and well-nourished. No distress.  HENT:  Head: Normocephalic and atraumatic.  Cardiovascular: Normal rate.   Respiratory:  Effort normal.  GI: Soft. She exhibits no distension and no mass. There is tenderness (very mild lower abdominal tenderness to palpation). There is no rebound and no guarding.  Genitourinary: Uterus is not enlarged and not tender. No bleeding in the vagina. No vaginal discharge found.  Neurological: She is alert and oriented to person, place, and time.  Skin: Skin is warm and dry. No erythema.  Psychiatric: She has a normal mood and affect.    Results for orders placed or performed during the hospital encounter of 07/08/17 (from the past 24 hour(s))  Urinalysis, Routine w reflex microscopic     Status: None   Collection Time: 07/08/17 12:55 AM  Result Value Ref Range   Color, Urine YELLOW YELLOW   APPearance CLEAR CLEAR   Specific Gravity, Urine 1.025 1.005 - 1.030   pH 5.0 5.0 - 8.0   Glucose, UA NEGATIVE NEGATIVE mg/dL   Hgb urine dipstick NEGATIVE NEGATIVE   Bilirubin Urine NEGATIVE NEGATIVE   Ketones, ur NEGATIVE NEGATIVE mg/dL   Protein, ur NEGATIVE NEGATIVE mg/dL   Nitrite NEGATIVE NEGATIVE   Leukocytes, UA NEGATIVE NEGATIVE  Pregnancy, urine POC     Status: Abnormal   Collection Time: 07/08/17  1:15 AM  Result Value Ref Range   Preg Test, Ur POSITIVE (A) NEGATIVE  Wet prep, genital     Status: None   Collection Time: 07/08/17  1:25 AM  Result Value Ref Range   Yeast Wet  Prep HPF POC NONE SEEN NONE SEEN   Trich, Wet Prep NONE SEEN NONE SEEN   Clue Cells Wet Prep HPF POC NONE SEEN NONE SEEN   WBC, Wet Prep HPF POC NONE SEEN NONE SEEN   Sperm NONE SEEN   CBC with Differential/Platelet     Status: Abnormal   Collection Time: 07/08/17  1:27 AM  Result Value Ref Range   WBC 11.8 (H) 4.0 - 10.5 K/uL   RBC 4.32 3.87 - 5.11 MIL/uL   Hemoglobin 12.8 12.0 - 15.0 g/dL   HCT 45.4 09.8 - 11.9 %   MCV 86.6 78.0 - 100.0 fL   MCH 29.6 26.0 - 34.0 pg   MCHC 34.2 30.0 - 36.0 g/dL   RDW 14.7 82.9 - 56.2 %   Platelets 292 150 - 400 K/uL   Neutrophils Relative % 70 %   Neutro Abs 8.3  (H) 1.7 - 7.7 K/uL   Lymphocytes Relative 25 %   Lymphs Abs 2.9 0.7 - 4.0 K/uL   Monocytes Relative 4 %   Monocytes Absolute 0.5 0.1 - 1.0 K/uL   Eosinophils Relative 1 %   Eosinophils Absolute 0.1 0.0 - 0.7 K/uL   Basophils Relative 0 %   Basophils Absolute 0.0 0.0 - 0.1 K/uL  hCG, quantitative, pregnancy     Status: Abnormal   Collection Time: 07/08/17  1:27 AM  Result Value Ref Range   hCG, Beta Chain, Quant, S 9,578 (H) <5 mIU/mL   US Ob Comp Less 14 Wks  Result Date: 07/08/2017 CLINICAL DATA:  Acute pelvic pain EXAM: OBSTETRIC <14 WK ULTRASOUND TECHNIQUE: Transabdominal ultrasound was performed for evaluation of the gestation as well as the maternal uterus and adnexal regions. COMPARISON:  None. FINDINGS: Intrauterine gestational sac: Visualized Yolk sac:  Visualized Embryo:  Not visualized Cardiac Activity: Not visualized MSD: 7 mm 5 w   2  d Subchorionic hemorrhage:  None visualized. Maternal uterus/adnexae: Cervical os closed. Maternal ovaries normal in size and contour bilaterally. Corpus luteum is seen in right ovary measuring 1.7 x 1.6 x 1.5 cm. No other extrauterine pelvic or adnexal mass. No free pelvic fluid. IMPRESSION: Gestational sac rather subtle yolk sac seen within the uterus. Fetal pole not yet seen. Given this circumstance, advise follow-up ultrasound in 10-14 days to assess for fetal pole. Study otherwise unremarkable.  Corpus luteum seen on the right. Electronically Signed   By: Bretta Bang III M.D.   On: 07/08/2017 02:29   US Ob Transvaginal  Result Date: 07/08/2017 CLINICAL DATA:  Acute pelvic pain EXAM: OBSTETRIC <14 WK ULTRASOUND TECHNIQUE: Transabdominal ultrasound was performed for evaluation of the gestation as well as the maternal uterus and adnexal regions. COMPARISON:  None. FINDINGS: Intrauterine gestational sac: Visualized Yolk sac:  Visualized Embryo:  Not visualized Cardiac Activity: Not visualized MSD: 7 mm 5 w   2  d Subchorionic hemorrhage:  None  visualized. Maternal uterus/adnexae: Cervical os closed. Maternal ovaries normal in size and contour bilaterally. Corpus luteum is seen in right ovary measuring 1.7 x 1.6 x 1.5 cm. No other extrauterine pelvic or adnexal mass. No free pelvic fluid. IMPRESSION: Gestational sac rather subtle yolk sac seen within the uterus. Fetal pole not yet seen. Given this circumstance, advise follow-up ultrasound in 10-14 days to assess for fetal pole. Study otherwise unremarkable.  Corpus luteum seen on the right. Electronically Signed   By: Bretta Bang III M.D.   On: 07/08/2017 02:29    MAU Course  Procedures None  MDM +UPT UA, wet prep, GC/chlamydia, CBC, quant hCG, HIV, RPR and [place] today to rule out ectopic pregnancy A+ blood type from previous pregnancy  Assessment and Plan  A: IUGS and YS at [redacted]w[redacted]d Abdominal pain in pregnancy, first trimester  P: Discharge home Tylenol PRN for pain advised  First trimester precautions discussed Outpatient viability [place] ordered due to discrepancy in dating on early [place] from LMP. Patient will go to [facility] for results after US performed Patient may return to MAU as needed or if her condition were to change or worsen  Vonzella Nipple, PA-C 07/08/2017, 5:04 AM

## 2017-07-08 NOTE — Discharge Instructions (Signed)
Dolor abdominal durante el embarazo (Abdominal Pain During Pregnancy) El dolor de vientre (abdominal) es habitual durante el embarazo. Generalmente no se trata de un problema grave. Otras veces puede ser un signo de que algo no anda bien. Siempre comunquese con su mdico si tiene dolor abdominal. CUIDADOS EN EL HOGAR Controle el dolor para ver si hay cambios. Las indicaciones que siguen pueden ayudarla a sentirse mejor:  Notenga sexo (relaciones sexuales) ni se coloque nada dentro de la vagina hasta que se sienta mejor.  Haga reposo hasta que el dolor se calme.  Si siente ganas de vomitar (nuseas ) beba lquidos claros. No consuma alimentos slidos hasta que se sienta mejor.  Slo tome los medicamentos que le haya indicado su mdico.  Cumpla con las visitas al mdico segn las indicaciones. SOLICITE AYUDA DE INMEDIATO SI:  Tiene un sangrado, pierde lquido o elimina trozos de tejido por la vagina.  Siente ms dolor o clicos.  Comienza a vomitar.  Siente dolor al orinar u observa sangre en la orina.  Tiene fiebre.  No siente que el beb se mueva mucho.  Se siente muy dbil o cree que va a desmayarse.  Tiene dificultad para respirar con o sin dolor en el vientre.  Siente un dolor de cabeza muy intenso y dolor en el vientre.  Observa que sale un lquido por la vagina y tiene dolor abdominal.  La materia fecal es lquida (diarrea).  El dolor en el viente no desaparece, o empeora, luego de hacer reposo. ASEGRESE DE QUE:  Comprende estas instrucciones.  Controlar su afeccin.  Recibir ayuda de inmediato si no mejora o si empeora. Esta informacin no tiene como fin reemplazar el consejo del mdico. Asegrese de hacerle al mdico cualquier pregunta que tenga. Document Released: 06/22/2011 Document Revised: 02/01/2016 Document Reviewed: 05/09/2013 Elsevier Interactive Patient Education  2018 Elsevier Inc.  

## 2017-07-08 NOTE — MAU Note (Signed)
ABd pain since 1900. No bleeding. Cramping pain that comes and goes

## 2017-07-09 LAB — RPR: RPR Ser Ql: NONREACTIVE

## 2017-07-10 LAB — GC/CHLAMYDIA PROBE AMP (~~LOC~~) NOT AT ARMC
CHLAMYDIA, DNA PROBE: NEGATIVE
NEISSERIA GONORRHEA: NEGATIVE

## 2017-07-13 ENCOUNTER — Ambulatory Visit (INDEPENDENT_AMBULATORY_CARE_PROVIDER_SITE_OTHER): Payer: Self-pay | Admitting: *Deleted

## 2017-07-13 ENCOUNTER — Ambulatory Visit (HOSPITAL_COMMUNITY)
Admission: RE | Admit: 2017-07-13 | Discharge: 2017-07-13 | Disposition: A | Discharge status: Home / Self Care | Payer: Self-pay | Source: Ambulatory Visit | Attending: Medical | Admitting: Medical

## 2017-07-13 DIAGNOSIS — R109 Unspecified abdominal pain: Secondary | ICD-10-CM | POA: Insufficient documentation

## 2017-07-13 DIAGNOSIS — Z3A01 Less than 8 weeks gestation of pregnancy: Secondary | ICD-10-CM | POA: Insufficient documentation

## 2017-07-13 DIAGNOSIS — O3680X0 Pregnancy with inconclusive fetal viability, not applicable or unspecified: Secondary | ICD-10-CM

## 2017-07-13 DIAGNOSIS — O26891 Other specified pregnancy related conditions, first trimester: Secondary | ICD-10-CM | POA: Insufficient documentation

## 2017-07-13 NOTE — Progress Notes (Signed)
Here for [place] results. Reviewed with Dr. Adrian Blackwater and instructed to tell patient we can not confirm if there is a baby or not yet; needs an ultrasound in about 10 days.  Informed patient results per Dr. Adrian Blackwater. Patient states worried about paying for [place], discussed with Dr. Adrian Blackwater and informed her we understand her concerns but getting Korea is the best way to determine if there is a live baby or not.  Encouraged her to pick up a financial assistance packet at front desk. She voices understanding.

## 2017-07-14 ENCOUNTER — Telehealth: Payer: Self-pay | Admitting: *Deleted

## 2017-07-14 NOTE — Telephone Encounter (Signed)
Pt called with concerns about why ultrasound was being repeated. Advised patient that we were unable to see everything that we needed to see on the previous ultrasound and needed to repeat it to give her body time. Patient is agreeable to this and had no further questions.

## 2017-07-25 ENCOUNTER — Ambulatory Visit (HOSPITAL_COMMUNITY)
Admission: RE | Admit: 2017-07-25 | Discharge: 2017-07-25 | Disposition: A | Discharge status: Home / Self Care | Payer: Self-pay | Source: Ambulatory Visit | Attending: Family Medicine | Admitting: Family Medicine

## 2017-07-25 ENCOUNTER — Ambulatory Visit: Payer: Self-pay | Admitting: *Deleted

## 2017-07-25 DIAGNOSIS — Z712 Person consulting for explanation of examination or test findings: Secondary | ICD-10-CM

## 2017-07-25 DIAGNOSIS — Z3A01 Less than 8 weeks gestation of pregnancy: Secondary | ICD-10-CM | POA: Insufficient documentation

## 2017-07-25 DIAGNOSIS — O3680X0 Pregnancy with inconclusive fetal viability, not applicable or unspecified: Secondary | ICD-10-CM | POA: Insufficient documentation

## 2017-07-25 NOTE — Progress Notes (Signed)
Chart reviewed for nurse visit. Agree with plan of care.   Leighanne Adolph, NP 07/25/2017 2:02 PM   

## 2017-07-25 NOTE — Progress Notes (Signed)
Video interpreter Christiane Ha [phone] used for encounter today.  Pt informed of today's [place] results after review by Judeth Horn, NP.  EDD is 03/08/18.  Medication reconciliation completed. Pt will schedule prenatal care @ [facility].

## 2017-08-02 ENCOUNTER — Other Ambulatory Visit: Payer: Self-pay

## 2017-08-02 DIAGNOSIS — Z3482 Encounter for supervision of other normal pregnancy, second trimester: Secondary | ICD-10-CM

## 2017-08-02 LAB — POCT URINALYSIS DIP (MANUAL ENTRY)
Bilirubin, UA: NEGATIVE
Glucose, UA: NEGATIVE mg/dL
Ketones, POC UA: NEGATIVE mg/dL
Leukocytes, UA: NEGATIVE
Nitrite, UA: NEGATIVE
PROTEIN UA: NEGATIVE mg/dL
RBC UA: NEGATIVE
SPEC GRAV UA: 1.02 (ref 1.010–1.025)
UROBILINOGEN UA: 0.2 U/dL
pH, UA: 7 (ref 5.0–8.0)

## 2017-08-03 LAB — OBSTETRIC PANEL, INCLUDING HIV
ANTIBODY SCREEN: NEGATIVE
BASOS: 0 %
Basophils Absolute: 0 10*3/uL (ref 0.0–0.2)
EOS (ABSOLUTE): 0.1 10*3/uL (ref 0.0–0.4)
EOS: 1 %
HEMATOCRIT: 37.8 % (ref 34.0–46.6)
HEMOGLOBIN: 12.9 g/dL (ref 11.1–15.9)
HIV SCREEN 4TH GENERATION: NONREACTIVE
Hepatitis B Surface Ag: NEGATIVE
Immature Grans (Abs): 0.1 10*3/uL (ref 0.0–0.1)
Immature Granulocytes: 1 %
LYMPHS ABS: 2.4 10*3/uL (ref 0.7–3.1)
Lymphs: 21 %
MCH: 29.9 pg (ref 26.6–33.0)
MCHC: 34.1 g/dL (ref 31.5–35.7)
MCV: 88 fL (ref 79–97)
MONOS ABS: 0.6 10*3/uL (ref 0.1–0.9)
Monocytes: 6 %
NEUTROS PCT: 71 %
Neutrophils Absolute: 8.1 10*3/uL — ABNORMAL HIGH (ref 1.4–7.0)
Platelets: 290 10*3/uL (ref 150–379)
RBC: 4.32 x10E6/uL (ref 3.77–5.28)
RDW: 13.9 % (ref 12.3–15.4)
RH TYPE: POSITIVE
RPR Ser Ql: NONREACTIVE
Rubella Antibodies, IGG: 6.48 index (ref >0.99)
WBC: 11.4 10*3/uL — AB (ref 3.4–10.8)

## 2017-08-03 LAB — SICKLE CELL SCREEN: Sickle Cell Screen: NEGATIVE

## 2017-08-04 LAB — URINE CULTURE, OB REFLEX

## 2017-08-04 LAB — CULTURE, OB URINE

## 2017-08-07 ENCOUNTER — Inpatient Hospital Stay (HOSPITAL_COMMUNITY)
Admission: AD | Admit: 2017-08-07 | Discharge: 2017-08-07 | Disposition: A | Discharge status: Home / Self Care | Payer: Self-pay | Source: Ambulatory Visit | Attending: Family Medicine | Admitting: Family Medicine

## 2017-08-07 ENCOUNTER — Encounter (HOSPITAL_COMMUNITY): Payer: Self-pay

## 2017-08-07 ENCOUNTER — Inpatient Hospital Stay (HOSPITAL_COMMUNITY): Payer: Self-pay

## 2017-08-07 DIAGNOSIS — Z3A09 9 weeks gestation of pregnancy: Secondary | ICD-10-CM | POA: Insufficient documentation

## 2017-08-07 DIAGNOSIS — R1011 Right upper quadrant pain: Secondary | ICD-10-CM | POA: Insufficient documentation

## 2017-08-07 DIAGNOSIS — M549 Dorsalgia, unspecified: Secondary | ICD-10-CM

## 2017-08-07 DIAGNOSIS — O9989 Other specified diseases and conditions complicating pregnancy, childbirth and the puerperium: Secondary | ICD-10-CM

## 2017-08-07 DIAGNOSIS — O26891 Other specified pregnancy related conditions, first trimester: Secondary | ICD-10-CM | POA: Insufficient documentation

## 2017-08-07 DIAGNOSIS — R1031 Right lower quadrant pain: Secondary | ICD-10-CM | POA: Insufficient documentation

## 2017-08-07 DIAGNOSIS — O34211 Maternal care for low transverse scar from previous cesarean delivery: Secondary | ICD-10-CM | POA: Insufficient documentation

## 2017-08-07 LAB — CBC WITH DIFFERENTIAL/PLATELET
BASOS ABS: 0 10*3/uL (ref 0.0–0.1)
BASOS PCT: 0 %
Eosinophils Absolute: 0.2 10*3/uL (ref 0.0–0.7)
Eosinophils Relative: 2 %
HEMATOCRIT: 38.8 % (ref 36.0–46.0)
HEMOGLOBIN: 13.5 g/dL (ref 12.0–15.0)
Lymphocytes Relative: 29 %
Lymphs Abs: 3.3 10*3/uL (ref 0.7–4.0)
MCH: 30.3 pg (ref 26.0–34.0)
MCHC: 34.8 g/dL (ref 30.0–36.0)
MCV: 87.2 fL (ref 78.0–100.0)
MONO ABS: 0.4 10*3/uL (ref 0.1–1.0)
Monocytes Relative: 3 %
NEUTROS ABS: 7.5 10*3/uL (ref 1.7–7.7)
NEUTROS PCT: 66 %
Platelets: 287 10*3/uL (ref 150–400)
RBC: 4.45 MIL/uL (ref 3.87–5.11)
RDW: 14.1 % (ref 11.5–15.5)
WBC: 11.3 10*3/uL — AB (ref 4.0–10.5)

## 2017-08-07 LAB — COMPREHENSIVE METABOLIC PANEL
ALBUMIN: 3.7 g/dL (ref 3.5–5.0)
ALK PHOS: 76 U/L (ref 38–126)
ALT: 17 U/L (ref 14–54)
AST: 18 U/L (ref 15–41)
Anion gap: 9 (ref 5–15)
BILIRUBIN TOTAL: 0.4 mg/dL (ref 0.3–1.2)
BUN: 9 mg/dL (ref 6–20)
CALCIUM: 9.3 mg/dL (ref 8.9–10.3)
CO2: 22 mmol/L (ref 22–32)
Chloride: 103 mmol/L (ref 101–111)
Creatinine, Ser: 0.41 mg/dL — ABNORMAL LOW (ref 0.44–1.00)
GFR calc Af Amer: >60 mL/min (ref >60)
GLUCOSE: 92 mg/dL (ref 65–99)
POTASSIUM: 3.8 mmol/L (ref 3.5–5.1)
Sodium: 134 mmol/L — ABNORMAL LOW (ref 135–145)
TOTAL PROTEIN: 7 g/dL (ref 6.5–8.1)

## 2017-08-07 LAB — LIPASE, BLOOD: LIPASE: 23 U/L (ref 11–51)

## 2017-08-07 LAB — URINALYSIS, ROUTINE W REFLEX MICROSCOPIC
Bilirubin Urine: NEGATIVE
Glucose, UA: NEGATIVE mg/dL
Hgb urine dipstick: NEGATIVE
KETONES UR: NEGATIVE mg/dL
LEUKOCYTES UA: NEGATIVE
NITRITE: NEGATIVE
PROTEIN: NEGATIVE mg/dL
Specific Gravity, Urine: 1.015 (ref 1.005–1.030)
pH: 5 (ref 5.0–8.0)

## 2017-08-07 MED ORDER — LACTATED RINGERS IV SOLN
INTRAVENOUS | Status: DC
  Administered 2017-08-07: 21:00:00 via INTRAVENOUS

## 2017-08-07 MED ORDER — PROMETHAZINE HCL 25 MG/ML IJ SOLN
25.0000 mg | Freq: Once | INTRAMUSCULAR | Status: AC
  Administered 2017-08-07: 25 mg via INTRAVENOUS
  Filled 2017-08-07: qty 1

## 2017-08-07 MED ORDER — HYDROMORPHONE HCL 2 MG/ML IJ SOLN
2.0000 mg | INTRAMUSCULAR | Status: DC | PRN
  Administered 2017-08-07: 2 mg via INTRAVENOUS
  Filled 2017-08-07: qty 1

## 2017-08-07 NOTE — MAU Provider Note (Signed)
History     CSN: [id number]  Arrival date and time: 08/07/17 4098   First Provider Initiated Contact with Patient 08/07/17 2039      Chief Complaint  Patient presents with  . Abdominal Pain   G2P1001  here with RLQ & RUQ pain, and right back pain since this am. She describes as sharp and constant. She took Tylenol and had no relief. Associated sx are nausea but no vomiting. She reports urinary frequency and urgency x3 days. No dysuria or hematuria. No fever. Denies VB. She thinks she has a hx of kidney stones.     OB History    Gravida Para Term Preterm AB Living   0 0 1   SAB TAB Ectopic Multiple Live Births   0 0 0 0 1      Past Medical History:  Diagnosis Date  . Anemia   . Dysmenorrhea   . Obesity (BMI 30-39.9)   . PCO (polycystic ovaries)     Past Surgical History:  Procedure Laterality Date  . CESAREAN SECTION N/A 10/02/2014   Procedure: CESAREAN SECTION;  Surgeon: Adam Phenix, MD;  Location: [facility];  Service: Obstetrics;  Laterality: N/A;  . NO PAST SURGERIES      History reviewed. No pertinent family history.  Social History  Substance Use Topics  . Smoking status: Never Smoker  . Smokeless tobacco: Never Used  . Alcohol use No    Allergies: No Known Allergies  Prescriptions Prior to Admission  Medication Sig Dispense Refill Last Dose  . acetaminophen (TYLENOL) 500 MG tablet Take 500 mg by mouth every 6 (six) hours as needed.   08/07/2017 at Unknown time  . Prenatal Vit-Fe Fumarate-FA (MULTIVITAMIN-PRENATAL) 27-0.8 MG TABS tablet Take 1 tablet by mouth daily at 12 noon.   08/07/2017 at Unknown time    Review of Systems  Constitutional: Negative for chills and fever.  Gastrointestinal: Positive for abdominal pain and nausea. Negative for constipation, diarrhea and vomiting.  Genitourinary: Positive for frequency and urgency. Negative for dysuria, hematuria and vaginal bleeding.  Musculoskeletal: Positive for back pain.    Neurological: Positive for headaches.   Physical Exam   Blood pressure 114/83, pulse 88, temperature 98 F (36.7 C), temperature source Oral, resp. rate 17, height 4' 10.5" (1.486 m), weight 160 lb (72.6 kg), last menstrual period 04/30/2017, SpO2 98 %, currently breastfeeding.  Physical Exam  Constitutional: She is oriented to person, place, and time. She appears well-developed and well-nourished. No distress (appears uncomfortable).  HENT:  Head: Normocephalic and atraumatic.  Neck: Normal range of motion.  Cardiovascular: Normal rate.   Respiratory: Effort normal.  GI: Soft. She exhibits no distension. There is tenderness in the right lower quadrant and suprapubic area. There is CVA tenderness (right).  Musculoskeletal: Normal range of motion.       Arms: Neurological: She is alert and oriented to person, place, and time.  Skin: Skin is warm and dry.  Psychiatric: She has a normal mood and affect.   Results for orders placed or performed during the hospital encounter of 08/07/17 (from the past 24 hour(s))  CBC with Differential/Platelet     Status: Abnormal   Collection Time: 08/07/17  8:53 PM  Result Value Ref Range   WBC 11.3 (H) 4.0 - 10.5 K/uL   RBC 4.45 3.87 - 5.11 MIL/uL   Hemoglobin 13.5 12.0 - 15.0 g/dL   HCT 11.9 14.7 - 82.9 %   MCV 87.2 78.0 - 100.0 fL  MCH 30.3 26.0 - 34.0 pg   MCHC 34.8 30.0 - 36.0 g/dL   RDW 95.2 84.1 - 32.4 %   Platelets 287 150 - 400 K/uL   Neutrophils Relative % 66 %   Neutro Abs 7.5 1.7 - 7.7 K/uL   Lymphocytes Relative 29 %   Lymphs Abs 3.3 0.7 - 4.0 K/uL   Monocytes Relative 3 %   Monocytes Absolute 0.4 0.1 - 1.0 K/uL   Eosinophils Relative 2 %   Eosinophils Absolute 0.2 0.0 - 0.7 K/uL   Basophils Relative 0 %   Basophils Absolute 0.0 0.0 - 0.1 K/uL  Comprehensive metabolic panel     Status: Abnormal   Collection Time: 08/07/17  8:53 PM  Result Value Ref Range   Sodium 134 (L) 135 - 145 mmol/L   Potassium 3.8 3.5 - 5.1 mmol/L    Chloride 103 101 - 111 mmol/L   CO2 22 22 - 32 mmol/L   Glucose, Bld 92 65 - 99 mg/dL   BUN 9 6 - 20 mg/dL   Creatinine, Ser 4.01 (L) 0.44 - 1.00 mg/dL   Calcium 9.3 8.9 - 02.7 mg/dL   Total Protein 7.0 6.5 - 8.1 g/dL   Albumin 3.7 3.5 - 5.0 g/dL   AST 18 15 - 41 U/L   ALT 17 14 - 54 U/L   Alkaline Phosphatase 76 38 - 126 U/L   Total Bilirubin 0.4 0.3 - 1.2 mg/dL   GFR calc non Af Amer >60 >60 mL/min   GFR calc Af Amer >60 >60 mL/min   Anion gap 9 5 - 15  Lipase, blood     Status: None   Collection Time: 08/07/17  8:53 PM  Result Value Ref Range   Lipase 23 11 - 51 U/L  Urinalysis, Routine w reflex microscopic     Status: Abnormal   Collection Time: 08/07/17  9:00 PM  Result Value Ref Range   Color, Urine STRAW (A) YELLOW   APPearance CLEAR CLEAR   Specific Gravity, Urine 1.015 1.005 - 1.030   pH 5.0 5.0 - 8.0   Glucose, UA NEGATIVE NEGATIVE mg/dL   Hgb urine dipstick NEGATIVE NEGATIVE   Bilirubin Urine NEGATIVE NEGATIVE   Ketones, ur NEGATIVE NEGATIVE mg/dL   Protein, ur NEGATIVE NEGATIVE mg/dL   Nitrite NEGATIVE NEGATIVE   Leukocytes, UA NEGATIVE NEGATIVE  US Abdomen Limited Ruq  Result Date: 08/07/2017 CLINICAL DATA:  Right upper quadrant abdominal pain and back pain today. Patient is [redacted] weeks pregnant. EXAM: ULTRASOUND ABDOMEN LIMITED RIGHT UPPER QUADRANT COMPARISON:  11/02/2016 FINDINGS: Gallbladder: No gallstones or wall thickening visualized. No sonographic Murphy sign noted by sonographer. Common bile duct: Diameter: 3.9 mm, normal Liver: No focal lesion identified. Within normal limits in parenchymal echogenicity. Color flow Doppler imaging was not obtained. Portions of the right kidney are incidentally included within some images. No hydronephrosis as visualized. IMPRESSION: Normal examination. Electronically Signed   By: Burman Nieves M.D.   On: 08/07/2017 22:57   Limited bedside US: viable, active fetus, +cardiac activity, subj. nml AFV  MAU Course   Procedures Dilaudid Phenergan IVF  MDM Labs ordered and reviewed. No evidence of acute abdominal process. No evidence of pyelo, UTI, or nephrolithiasis. Pain is improved after meds and mostly likely MSK. She is stable for discharge home.  Assessment and Plan   1. Musculoskeletal back pain   2. RUQ pain   3. RLQ abdominal pain    Discharge home Follow up at [facility] in 2  days as [facility] for [facility] Return precautions Tylenol prn Heating pad prn  Allergies as of 08/07/2017   No Known Allergies     Medication List    TAKE these medications   acetaminophen 500 MG tablet Commonly known as:  TYLENOL Take 500 mg by mouth every 6 (six) hours as needed.   multivitamin-prenatal 27-0.8 MG Tabs tablet Take 1 tablet by mouth daily at 12 noon.      Live and video Interpreter used for encounter  Donette Larry, CNM 08/07/2017, 11:11 PM

## 2017-08-07 NOTE — MAU Note (Signed)
Pt reports lower abdominal pain that started this morning. This afternoon it started spreading to right upper quadrant of abdomen and back. Pt states the pain is constant and sharp. Pt denies vaginal bleeding or discharge. Pt states she has frequency with urination, but denies burning or pain with urination. Pt states she took Tylenol around lunch time-did not help.

## 2017-08-07 NOTE — Discharge Instructions (Signed)
Dolor [employer] (Acute Pain, Adult) El dolor agudo es un tipo de dolor que dura solo algunos das o tanto como seis meses. A menudo se lo relaciona con una enfermedad, una lesin o un procedimiento mdico. El dolor agudo puede ser leve, moderado o intenso. Generalmente desaparece una vez que la lesin se ha curado o que el paciente ya no est enfermo. El dolor puede dificultar las actividades cotidianas. Puede causar ansiedad y derivar en otros problemas si no se lo trata. El tratamiento depende de la causa y de la intensidad del dolor agudo. INSTRUCCIONES PARA EL CUIDADO EN EL HOGAR  Contrlese el nivel del dolor como se lo haya indicado el mdico.  Tome los medicamentos de venta libre y los recetados solamente como se lo haya indicado el mdico.  Si est tomando un analgsico recetado: ? Pregntele al mdico si puede tomar un laxante emoliente o un laxante para evitar el estreimiento. ? No deje de tomar el medicamento de manera repentina. Hable con el mdico acerca de cmo y cundo suspender el analgsico recetado. ? Si el dolor es intenso, no tome ms comprimidos que los que le haya indicado el mdico. ? No tome analgsicos de venta libre adems de [place], a menos que se lo haya indicado el mdico. ? No conduzca ni opere maquinaria pesada mientras toma analgsicos recetados.  Aplquese hielo o calor como se lo haya indicado el mdico. Estos pueden reducir la hinchazn y Engineer, materials.  Pregntele al mdico si otras estrategias, como la distraccin, la relajacin o las fisioterapias pueden Garment/textile technologist.  Concurra a todas las visitas de control como se lo haya indicado el mdico. Esto es importante. SOLICITE ATENCIN MDICA SI:  No puede controlar el dolor con medicamentos.  El dolor no mejora o [place].  Los [employer] causan efectos secundarios, como vmitos o confusin. SOLICITE ATENCIN MDICA DE INMEDIATO SI:  Siente dolor intenso.  Tiene  dificultad para respirar.  Pierde la conciencia.  Tiene dolor u opresin en el pecho que duran ms de unos pocos minutos. Junto con el dolor de [place], puede tener lo siguiente: ? Dolor o [employer] o ambos brazos, en la espalda, el cuello, la mandbula o el [place]. ? Falta de aire. ? Le aparece sudor fro. ? Sentir nuseas. ? Siente que est por desvanecerse. Estos sntomas pueden representar un problema grave que constituye Radio broadcast assistant. No espere hasta que los sntomas desaparezcan. Solicite atencin mdica de inmediato. Comunquese con el servicio de emergencias de su localidad (911 en los [place]). No conduzca por sus propios medios [employer]. Esta informacin no tiene Theme park manager el consejo del mdico. Asegrese de hacerle al mdico cualquier pregunta que tenga. Document Released: 10/25/2015 Document Revised: 10/25/2015 Document Reviewed: 10/25/2015 Elsevier Interactive Patient Education  2018 [employer].  Dolor abdominal en el embarazo (Abdominal Pain During Pregnancy) El dolor abdominal es frecuente durante el [place]. Generalmente no causa ningn dao. El dolor abdominal puede tener numerosas causas. Algunas causas son ms graves que otras. Ciertas causas de dolor abdominal durante el embarazo se diagnostican fcilmente. A veces, se tarda un tiempo para llegar al diagnstico. Otras veces la causa no se conoce. El dolor abdominal puede estar relacionado con [place] alteracin del [place], o puede deberse a una causa totalmente diferente. Por este motivo, siempre consulte a su mdico cuando sienta molestias abdominales. INSTRUCCIONES PARA EL CUIDADO EN EL HOGAR  Est atenta al dolor para ver si hay cambios.  Las siguientes indicaciones ayudarn a Psychologist, educational [employer] pueda sentir:  No Chiropodist sexuales y no coloque nada dentro de la vagina hasta que los sntomas hayan desaparecido completamente.  Descanse todo lo que pueda [employer] dolor se le haya calmado.  Si siente nuseas, beba lquidos claros. Evite los alimentos slidos mientras sienta malestar o tenga nuseas.  Tome slo medicamentos de venta libre o recetados, segn las indicaciones del mdico.  Cumpla con todas las visitas de control, segn le indique su mdico. SOLICITE ATENCIN MDICA DE INMEDIATO SI:  Tiene un sangrado, prdida de lquidos o elimina tejidos por la vagina.  El dolor o los clicos [place].  Tiene vmitos persistentes.  Comienza a Financial risk analyst al orinar u [employer].  Tiene fiebre.  Nota que los movimientos del beb disminuyen.  Siente intensa debilidad o se marea.  Tiene dificultad para respirar con o sin dolor abdominal.  Siente un dolor de cabeza intenso junto al dolor abdominal.  Shelle [place] secrecin vaginal anormal con dolor abdominal.  Tiene diarrea persistente.  El dolor abdominal sigue o empeora an despus de Field seismologist. ASEGRESE DE QUE:   Comprende estas instrucciones.  Controlar su afeccin.  Recibir ayuda de inmediato si no mejora o si empeora. Esta informacin no tiene Theme park manager el consejo del mdico. Asegrese de hacerle al mdico cualquier pregunta que tenga. Document Released: 10/10/2005 Document Revised: 02/01/2016 Elsevier Interactive Patient Education  2017 [employer].

## 2017-08-09 ENCOUNTER — Ambulatory Visit (INDEPENDENT_AMBULATORY_CARE_PROVIDER_SITE_OTHER): Payer: Self-pay | Admitting: Internal Medicine

## 2017-08-09 ENCOUNTER — Encounter: Payer: Self-pay | Admitting: Internal Medicine

## 2017-08-09 VITALS — BP 100/62 | HR 66 | Temp 98.2°F | Wt 159.8 lb

## 2017-08-09 DIAGNOSIS — Z3491 Encounter for supervision of normal pregnancy, unspecified, first trimester: Secondary | ICD-10-CM

## 2017-08-09 LAB — CULTURE, OB URINE: CULTURE: NO GROWTH

## 2017-08-09 LAB — POCT 1 HR PRENATAL GLUCOSE: Glucose 1 Hr Prenatal, POC: 78 mg/dL

## 2017-08-09 LAB — GLUCOSE, 1 HOUR: Glucose 1 Hour: 78

## 2017-08-09 NOTE — Patient Instructions (Signed)
Primer trimestre de embarazo (First Trimester of Pregnancy) El primer trimestre de embarazo se extiende desde la semana1 hasta el final de la semana12 (mes1 al mes3). Una semana despus de que un espermatozoide fecunda un vulo, este se implantar en la pared uterina. Este embrin comenzar a desarrollarse hasta convertirse en un beb. Sus genes y los de su pareja forman el beb. Los genes del varn determinan si ser un nio o una nia. Entre la semana6 y la8, se forman los ojos y el rostro, y los latidos del corazn pueden verse en la ecografa. Al final de las 12semanas, todos los rganos del beb estn formados. Ahora que est embarazada, querr hacer todo lo que est a su alcance para tener un beb sano. Dos de las cosas ms importantes son tener una buena atencin prenatal y seguir las indicaciones del mdico. La atencin prenatal incluye toda la asistencia mdica que usted recibe antes del nacimiento del beb. Esta ayudar a prevenir, detectar y tratar cualquier problema durante el embarazo y el parto. CAMBIOS EN EL ORGANISMO Su organismo atraviesa por muchos cambios durante el embarazo, y estos varan de una mujer a otra.  Al principio, puede aumentar o bajar algunos kilos.  Puede tener malestar estomacal (nuseas) y vomitar. Si no puede controlar los vmitos, llame al mdico.  Puede cansarse con facilidad.  Es posible que tenga dolores de cabeza que pueden aliviarse con los medicamentos que el mdico le permita tomar.  Puede orinar con mayor frecuencia. El dolor al orinar puede significar que usted tiene una infeccin de la vejiga.  Debido al embarazo, puede tener acidez estomacal.  Puede estar estreida, ya que ciertas hormonas enlentecen los movimientos de los msculos que empujan los desechos a travs de los intestinos.  Pueden aparecer hemorroides o abultarse e hincharse las venas (venas varicosas).  Las mamas pueden empezar a agrandarse y estar sensibles. Los pezones  pueden sobresalir ms, y el tejido que los rodea (areola) tornarse ms oscuro.  Las encas pueden sangrar y estar sensibles al cepillado y al hilo dental.  Pueden aparecer zonas oscuras o manchas (cloasma, mscara del embarazo) en el rostro que probablemente se atenuarn despus del nacimiento del beb.  Los perodos menstruales se interrumpirn.  Tal vez no tenga apetito.  Puede sentir un fuerte deseo de consumir ciertos alimentos.  Puede tener cambios a nivel emocional da a da, por ejemplo, por momentos puede estar emocionada por el embarazo y por otros preocuparse porque algo pueda salir mal con el embarazo o el beb.  Tendr sueos ms vvidos y extraos.  Tal vez haya cambios en el cabello que pueden incluir su engrosamiento, crecimiento rpido y cambios en la textura. A algunas mujeres tambin se les cae el cabello durante o despus del embarazo, o tienen el cabello seco o fino. Lo ms probable es que el cabello se le normalice despus del nacimiento del beb. QU DEBE ESPERAR EN LAS CONSULTAS PRENATALES Durante una visita prenatal de rutina:  La pesarn para asegurarse de que usted y el beb estn creciendo normalmente.  Le controlarn la presin arterial.  Le medirn el abdomen para controlar el desarrollo del beb.  Se escucharn los latidos cardacos a partir de la semana10 o la12 de embarazo, aproximadamente.  Se analizarn los resultados de los estudios solicitados en visitas anteriores. El mdico puede preguntarle:  Cmo se siente.  Si siente los movimientos del beb.  Si ha tenido sntomas anormales, como prdida de lquido, sangrado, dolores de cabeza intensos o clicos abdominales.    Si est consumiendo algn producto que contenga tabaco, como cigarrillos, tabaco de mascar y cigarrillos electrnicos.  Si tiene alguna pregunta. Otros estudios que pueden realizarse durante el primer trimestre incluyen lo siguiente:  Anlisis de sangre para determinar el tipo  de sangre y detectar la presencia de infecciones previas. Adems, se los usar para controlar si los niveles de hierro son bajos (anemia) y determinar los anticuerpos Rh. En una etapa ms avanzada del embarazo, se harn anlisis de sangre para saber si tiene diabetes, junto con otros estudios si surgen problemas.  Anlisis de orina para detectar infecciones, diabetes o protenas en la orina.  Una ecografa para confirmar que el beb crece y se desarrolla correctamente.  Una amniocentesis para diagnosticar posibles problemas genticos.  Estudios del feto para descartar espina bfida y sndrome de Down.  Es posible que necesite otras pruebas adicionales.  Prueba del VIH (virus de inmunodeficiencia humana). Los exmenes prenatales de rutina incluyen la prueba de deteccin del VIH, a menos que decida no realizrsela. INSTRUCCIONES PARA EL CUIDADO EN EL HOGAR Medicamentos:  Siga las indicaciones del mdico en relacin con el uso de medicamentos. Durante el embarazo, hay medicamentos que pueden tomarse y otros que no.  Tome las vitaminas prenatales como se le indic.  Si est estreida, tome un laxante suave, si el mdico lo autoriza. Dieta  Consuma alimentos balanceados. Elija alimentos variados, como carne o protenas de origen vegetal, pescado, leche y productos lcteos descremados, verduras, frutas y panes y cereales integrales. El mdico la ayudar a determinar la cantidad de peso que puede aumentar.  No coma carne cruda ni quesos sin cocinar. Estos elementos contienen bacterias que pueden causar defectos congnitos en el beb.  La ingesta diaria de cuatro o cinco comidas pequeas en lugar de tres comidas abundantes puede ayudar a aliviar las nuseas y los vmitos. Si empieza a tener nuseas, comer algunas galletas saladas puede ser de ayuda. Beber lquidos entre las comidas en lugar de tomarlos durante las comidas tambin puede ayudar a calmar las nuseas y los vmitos.  Si est  estreida, consuma alimentos con alto contenido de fibra, como verduras y frutas frescas, y cereales integrales. Beba suficiente lquido para mantener la orina clara o de color amarillo plido. Actividad y ejercicios  Haga ejercicio solamente como se lo haya indicado el mdico. El ejercicio la ayudar a: ? Controlar el peso. ? Mantenerse en forma. ? Estar preparada para el trabajo de parto y el parto.  Los dolores, los clicos en la parte baja del abdomen o los calambres en la cintura son un buen indicio de que debe dejar de hacer ejercicios. Consulte al mdico antes de seguir haciendo ejercicios normales.  Intente no estar de pie durante mucho tiempo. Mueva las piernas con frecuencia si debe estar de pie en un lugar durante mucho tiempo.  Evite levantar pesos excesivos.  Use zapatos de tacones bajos y mantenga una buena postura.  Puede seguir teniendo relaciones sexuales, excepto que el mdico le indique lo contrario. Alivio del dolor o las molestias  Use un sostn que le brinde buen soporte si siente dolor a la palpacin en las mamas.  Dese baos de asiento con agua tibia para aliviar el dolor o las molestias causadas por las hemorroides. Use crema antihemorroidal si el mdico se lo permite.  Descanse con las piernas elevadas si tiene calambres o dolor de cintura.  Si tiene venas varicosas en las piernas, use medias de descanso. Eleve los pies durante 15minutos, 3 o 4veces por   da. Limite la cantidad de sal en su dieta. Cuidados prenatales  Programe las visitas prenatales para la semana12 de embarazo. Generalmente se programan cada mes al principio y se hacen ms frecuentes en los 2 ltimos meses antes del parto.  Escriba sus preguntas. Llvelas cuando concurra a las visitas prenatales.  Concurra a todas las visitas prenatales como se lo haya indicado el mdico. Seguridad  Colquese el cinturn de seguridad cuando conduzca.  Haga una lista de los nmeros de telfono de  emergencia, que incluya los nmeros de telfono de familiares, amigos, el hospital y los departamentos de polica y bomberos. Consejos generales  Pdale al mdico que la derive a clases de educacin prenatal en su localidad. Debe comenzar a tomar las clases antes de entrar en el mes6 de embarazo.  Pida ayuda si tiene necesidades nutricionales o de asesoramiento durante el embarazo. El mdico puede aconsejarla o derivarla a especialistas para que la ayuden con diferentes necesidades.  No se d baos de inmersin en agua caliente, baos turcos ni saunas.  No se haga duchas vaginales ni use tampones o toallas higinicas perfumadas.  No mantenga las piernas cruzadas durante mucho tiempo.  Evite el contacto con las bandejas sanitarias de los gatos y la tierra que estos animales usan. Estos elementos contienen bacterias que pueden causar defectos congnitos al beb y la posible prdida del feto debido a un aborto espontneo o muerte fetal.  No fume, no consuma hierbas ni medicamentos que no hayan sido recetados por el mdico. Las sustancias qumicas que estos productos contienen afectan la formacin y el desarrollo del beb.  No consuma ningn producto que contenga tabaco, lo que incluye cigarrillos, tabaco de mascar y cigarrillos electrnicos. Si necesita ayuda para dejar de fumar, consulte al mdico. Puede recibir asesoramiento y otro tipo de recursos para dejar de fumar.  Programe una cita con el dentista. En su casa, lvese los dientes con un cepillo dental blando y psese el hilo dental con suavidad. SOLICITE ATENCIN MDICA SI:  Tiene mareos.  Siente clicos leves, presin en la pelvis o dolor persistente en el abdomen.  Tiene nuseas, vmitos o diarrea persistentes.  Tiene secrecin vaginal con mal olor.  Siente dolor al orinar.  Tiene el rostro, las manos, las piernas o los tobillos ms hinchados.  SOLICITE ATENCIN MDICA DE INMEDIATO SI:  Tiene fiebre.  Tiene una prdida de  lquido por la vagina.  Tiene sangrado o pequeas prdidas vaginales.  Siente dolor intenso o clicos en el abdomen.  Sube o baja de peso rpidamente.  Vomita sangre de color rojo brillante o material que parezca granos de caf.  Ha estado expuesta a la rubola y no ha sufrido la enfermedad.  Ha estado expuesta a la quinta enfermedad o a la varicela.  Tiene un dolor de cabeza intenso.  Le falta el aire.  Sufre cualquier tipo de traumatismo, por ejemplo, debido a una cada o un accidente automovilstico.  Esta informacin no tiene como fin reemplazar el consejo del mdico. Asegrese de hacerle al mdico cualquier pregunta que tenga. Document Released: 07/20/2005 Document Revised: 10/31/2014 Document Reviewed: 08/20/2013 Elsevier Interactive Patient Education  2017 Elsevier Inc.  

## 2017-08-09 NOTE — Progress Notes (Signed)
Amber KatzOfelia Warren is a 26 y.o. yo G2P1001 at 5874w6d who presents for her initial prenatal visit.  Pregnancy is planned She reports RUQ abdominal pain and back pain for several days. Was seen at [facility] for this 2 days ago. Has been taking Tylenol since but pain is still present. No fevers or vaginal bleeding. +Nausea but no vomiting. No reflux symptoms. Does report significant coughing a few weeks ago that has since resolved.  She  is taking PNV. See flow sheet for details.  PMH, POBH, FH, meds, allergies and Social Hx reviewed.  Prenatal Exam: Gen: Well nourished, well developed.  No distress.  Vitals noted. HEENT: Normocephalic, atraumatic.  Neck supple without cervical lymphadenopathy, thyromegaly or thyroid nodules.  Fair dentition. CV: RRR no murmur, gallops or rubs Lungs: CTAB.  Normal respiratory effort without wheezes or rales. Abd: soft, mildly TTP in right upper quadrant, ND. +BS.  Uterus not appreciated above pelvis. GU: Normal external female genitalia without lesions.  Normal vaginal, well rugated without lesions. No vaginal discharge.  Bimanual exam: No adnexal mass or TTP. No CMT.  Uterus size gravid.  Ext: No clubbing, cyanosis or edema. Psych: Normal grooming and dress.  Not depressed or anxious appearing.  Normal thought content and process without flight of ideas or looseness of associations. Unable to obtain fetal heart tones with doppler given GA and body habitus. Will obtain at next visit.   Assessment & Plan: 1) 26 y.o. yo G2P1001 at 2674w6d via early ultrasound doing well.  Current pregnancy issues include RUQ/back pain. Reviewed [facility] note from 2 days ago. Lipase, CMET, and UA negative. RUQ ultrasound without evidence of cholelithiasis or choledocolithiasis. Discussed with patient that area of pain is not near uterus and given lack of vaginal bleeding low suspicion for any harm to fetus at present. Work up for RUQ pain was all negative and patient has stable vital signs and  exam at present. May be related to  inflammation of intercostal muscles from recent coughing given history and location of pain. Discussed return precautions to [facility] of vaginal bleeding, pelvic pain, recurrent vomiting, fevers, etc.  Unsure if of immunity status for varicella. Will obtain varicella titer today.   Prior delivery by CS for failure to progress. Inserted in TOLAC. Will need visit with Dr. Shawnie PonsPratt in third trimester of pregnancy.  Dating is reliable.  Prenatal labs reviewed, unremarkable. Genetic screening offered: interested. Will obtain Quad screen today.   Early glucola is indicated due to ethnicity, BMI >25. Obtained today.  PHQ-9 and Pregnancy Medical Home forms completed and reviewed. PHQ-9 score of 12 but reports no difficulty or concerns with mood. No SI.  Reports PAP done recently at health dept. ROI completed.   Bleeding and pain precautions reviewed. Importance of prenatal vitamins reviewed.  Follow up in 4 weeks.  Marcy Sirenatherine Onita Pfluger, D.O. 08/09/2017, 12:16 PM PGY-3, [facility]

## 2017-08-10 LAB — VARICELLA ZOSTER ANTIBODY, IGM: Varicella IgM: <0.91 index (ref 0.00–0.90)

## 2017-09-05 ENCOUNTER — Encounter: Payer: Self-pay | Admitting: Student

## 2017-09-08 ENCOUNTER — Telehealth: Payer: Self-pay | Admitting: *Deleted

## 2017-09-08 ENCOUNTER — Ambulatory Visit (INDEPENDENT_AMBULATORY_CARE_PROVIDER_SITE_OTHER): Payer: Self-pay | Admitting: Student

## 2017-09-08 ENCOUNTER — Other Ambulatory Visit: Payer: Self-pay

## 2017-09-08 ENCOUNTER — Encounter: Payer: Self-pay | Admitting: Psychology

## 2017-09-08 ENCOUNTER — Encounter: Payer: Self-pay | Admitting: Student

## 2017-09-08 VITALS — BP 100/60 | HR 68 | Temp 97.8°F | Wt 163.4 lb

## 2017-09-08 DIAGNOSIS — R51 Headache: Secondary | ICD-10-CM

## 2017-09-08 DIAGNOSIS — F32A Depression, unspecified: Secondary | ICD-10-CM

## 2017-09-08 DIAGNOSIS — O99342 Other mental disorders complicating pregnancy, second trimester: Principal | ICD-10-CM

## 2017-09-08 DIAGNOSIS — R519 Headache, unspecified: Secondary | ICD-10-CM

## 2017-09-08 DIAGNOSIS — F329 Major depressive disorder, single episode, unspecified: Secondary | ICD-10-CM

## 2017-09-08 DIAGNOSIS — O99340 Other mental disorders complicating pregnancy, unspecified trimester: Secondary | ICD-10-CM

## 2017-09-08 DIAGNOSIS — O219 Vomiting of pregnancy, unspecified: Secondary | ICD-10-CM

## 2017-09-08 DIAGNOSIS — Z3402 Encounter for supervision of normal first pregnancy, second trimester: Secondary | ICD-10-CM

## 2017-09-08 MED ORDER — VITAMIN B-6 25 MG PO TABS: 25.0000 mg | ORAL_TABLET | Freq: Every day | ORAL | 0 refills | Status: DC

## 2017-09-08 MED ORDER — ACETAMINOPHEN ER 650 MG PO TBCR: 650.0000 mg | EXTENDED_RELEASE_TABLET | Freq: Three times a day (TID) | ORAL | 0 refills | Status: DC | PRN

## 2017-09-08 NOTE — Progress Notes (Signed)
Amber KatzOfelia Warren is a 26 y.o. G2P1001 at 4832w1d for routine follow up.  She reports  Poor appetite:  Reports nausea and vomiting every day. She went to outside clinic and she was given Zofran. She says she had a lot of constipation since then. She is not taking the Zofran quit often due to constipation.  Headache: since the start of her pregnancy. No changes to her headache. Headache constant. Sometimes worse at night but doesn't wake her up from sleep. Pain is mainly over top and temporal areas. Pain is throbbing. Tylenol didn't help much. Reports occ vision changes. No history of hypertension. She drinks water, about 1-2 litters  A/P: Pregnancy at 6432w1d.  Doing well.   Pregnancy issues include: Headache: Likely tension headache.  Recommended adequate hydration.  Given history of irritability with a PHQ 9 in the past, [facility]   Poor appetite: objectively, she gained about 4 pounds since her last visit about 4 weeks ago. Encouraged her to eat balanced diet and frequent small meals.   Nausea and vomiting: gave prescription for pralidoxime 25 mg tablet daily.  Recommended frequent small meals.  Anatomy ultrasound to be scheduled at health department.  Patient is [employer]. Form filled and handed to RN.  Pt  is interested in genetic screening.  Quad screen at next visit Bleeding and pain precautions reviewed. Follow up 4 weeks

## 2017-09-08 NOTE — Telephone Encounter (Signed)
Using pacific interpreter Lars MageJuan [phone]#[id number] LVM for pt to call back to give her the date and time of her US.  Appointment [facility] for [facility] scheduled for 10/12/2017 @ 10:30am. Please inform pt if she calls back also added note to let her know this at next visit. Lamonte SakaiZimmerman Rumple, April D, [place]

## 2017-09-08 NOTE — Assessment & Plan Note (Signed)
Assessment/Plan Recommendations:  Patient is presenting with low mood, lack of motivation to engage in previously enjoyed activities and multiple worries about her pregnancy. Her affect was appropriate during the session. Her GAD7 score (11) indicates moderate anxiety, while her PHQ9 score (18) indicates moderately severe depression. She denied item 9, SI. Patient recognizes that she has lack of motivation to do things but had some trouble problem solving around how to incorporate some more enjoyable activities into her life, such as working out.   [facility] provided basic psychoeducation about depression and anxiety and discussed behavioral interventions to help target some of the symptoms. Patient is interested in receiving behavioral treatment and [facility].

## 2017-09-08 NOTE — Progress Notes (Signed)
Dr. Arletha PiliGona requested a Behavioral Health Consult. Session was conducted via live interpreter who accompanied patient.   Presenting Issue:  Patient presented with anxiety and depressive symptoms that have been ongoing since the start of her pregnancy. She is 13.[redacted] weeks along. This is her second pregnancy but she reported she had no emotional or mood struggles during her first pregnancy. Patient endorses feeling down and having little motivation to engage in previously enjoyed activities, like working out and reports some barrier, including not having anyone who can help watch her son. She also re[ported worrying about numerous things such as her baby's delivery, her current living space and her weight.  [facility] provided very brief Psychoeducation about depression and anxiety and explained some treatment options.  Medical conditions that might explain or contribute to symptoms:  pregnancy  PHQ-9:  18 (patient denied item 9, SI) GAD-7:  11   Warmhandoff:  Complete

## 2017-09-08 NOTE — Patient Instructions (Signed)
Segundo trimestre de embarazo (Second Trimester of Pregnancy) El segundo trimestre va desde la semana13 hasta la 28, desde el cuarto hasta el sexto mes, y suele ser el momento en el que mejor se siente. En general, las nuseas matutinas han disminuido o han desaparecido completamente. Tendr ms energa y podr aumentarle el apetito. El beb por nacer (feto) se desarrolla rpidamente. Hacia el final del sexto mes, el beb mide aproximadamente 9 pulgadas (23 cm) y pesa alrededor de 1 libras (700 g). Es probable que sienta al beb moverse (dar pataditas) entre las 18 y 20 semanas del embarazo. CUIDADOS EN EL HOGAR  No fume, no consuma hierbas ni beba alcohol. No tome frmacos que el mdico no haya autorizado.  No consuma ningn producto que contenga tabaco, lo que incluye cigarrillos, tabaco de mascar o cigarrillos electrnicos. Si necesita ayuda para dejar de fumar, consulte al mdico. Puede recibir asesoramiento u otro tipo de apoyo para dejar de fumar.  Tome los medicamentos solamente como se lo haya indicado el mdico. Algunos medicamentos son seguros para tomar durante el embarazo y otros no lo son.  Haga ejercicios solamente como se lo haya indicado el mdico. Interrumpa la actividad fsica si comienza a tener calambres.  Ingiera alimentos saludables de manera regular.  Use un sostn que le brinde buen soporte si sus mamas estn sensibles.  No se d baos de inmersin en agua caliente, baos turcos ni saunas.  Colquese el cinturn de seguridad cuando conduzca.  No coma carne cruda ni queso sin cocinar; evite el contacto con las bandejas sanitarias de los gatos y la tierra que estos animales usan.  Tome las vitaminas prenatales.  Tome entre 1500 y 2000mg de calcio diariamente comenzando en la semana20 del embarazo hasta el parto.  Pruebe tomar un medicamento que la ayude a defecar (un laxante suave) si el mdico lo autoriza. Consuma ms fibra, que se encuentra en las frutas y  verduras frescas y los cereales integrales. Beba suficiente lquido para mantener el pis (orina) claro o de color amarillo plido.  Dese baos de asiento con agua tibia para aliviar el dolor o las molestias causadas por las hemorroides. Use una crema para las hemorroides si el mdico la autoriza.  Si se le hinchan las venas (venas varicosas), use medias de descanso. Levante (eleve) los pies durante 15minutos, 3 o 4veces por da. Limite el consumo de sal en su dieta.  No levante objetos pesados, use zapatos de tacones bajos y sintese derecha.  Descanse con las piernas elevadas si tiene calambres o dolor de cintura.  Visite a su dentista si no lo ha hecho durante el embarazo. Use un cepillo de cerdas suaves para cepillarse los dientes. Psese el hilo dental con suavidad.  Puede seguir manteniendo relaciones sexuales, a menos que el mdico le indique lo contrario.  Concurra a los controles mdicos.  SOLICITE AYUDA SI:  Siente mareos.  Sufre calambres o presin leves en la parte baja del vientre (abdomen).  Sufre un dolor persistente en el abdomen.  Tiene malestar estomacal (nuseas), vmitos, o tiene deposiciones acuosas (diarrea).  Advierte un olor ftido que proviene de la vagina.  Siente dolor al orinar.  SOLICITE AYUDA DE INMEDIATO SI:  Tiene fiebre.  Tiene una prdida de lquido por la vagina.  Tiene sangrado o pequeas prdidas vaginales.  Siente dolor intenso o clicos en el abdomen.  Sube o baja de peso rpidamente.  Tiene dificultades para recuperar el aliento y siente dolor en el pecho.  Sbitamente se   le hinchan mucho el rostro, las manos, los tobillos, los pies o las piernas.  No ha sentido los movimientos del beb durante una hora.  Siente un dolor de cabeza intenso que no se alivia con medicamentos.  Su visin se modifica.  Esta informacin no tiene como fin reemplazar el consejo del mdico. Asegrese de hacerle al mdico cualquier pregunta que  tenga. Document Released: 06/12/2013 Document Revised: 10/31/2014 Document Reviewed: 12/11/2012 Elsevier Interactive Patient Education  2017 Elsevier Inc.  

## 2017-09-22 ENCOUNTER — Ambulatory Visit: Payer: Self-pay

## 2017-09-29 ENCOUNTER — Ambulatory Visit (INDEPENDENT_AMBULATORY_CARE_PROVIDER_SITE_OTHER): Payer: Self-pay | Admitting: Psychology

## 2017-09-29 ENCOUNTER — Ambulatory Visit: Payer: Self-pay

## 2017-09-29 DIAGNOSIS — O99342 Other mental disorders complicating pregnancy, second trimester: Secondary | ICD-10-CM

## 2017-09-29 DIAGNOSIS — F329 Major depressive disorder, single episode, unspecified: Secondary | ICD-10-CM

## 2017-09-29 NOTE — Assessment & Plan Note (Signed)
Assessment/Plan Recommendations:  Session was conducted with live translator in the room.   Amber Warren presented with depressive and anxiety symptoms. Her GAD score (18) indicates severe anxiety and her PHQ9 score (9) indicate mild depression (she omitted a responce to item 5, and denied item 9, SI). Amber Warren has trouble controlling her worries and is also experiencing lack of motivation to do things she used to enjoy. While part of this is due to some pain she has experiences in her belly, causing her not want to exercise, part of it appears to be a result of her depressive symptoms. [facility] discussed the importance of engaging socially and Amber Warren will try to meet up with a friend next week.  [facility] also recommended that she report the pain she has experienced to her OB/GYN To help with anxiety, [facility] demonstrated some deep breathing technieques, which Amber Warren will try when she feels overwhelmed. Amber Warren [facility].

## 2017-09-29 NOTE — Progress Notes (Signed)
Reason for follow-up:  [facility] and [facility].   Issues discussed:  Amber Warren reported anxiety about multiple domains including the suitability of her current living space to her expanding family and how she will give birth (natural versus C-section) she prefers the former. Amber Warren reported very low motivation to do anything including meeting up with friends, which she used to enjoy and going outside to work out. She reported some pains in her belly and attributed part of her not exercising to this as she was worried about the baby. [facility] recommended she discuss the pains with her OB/GYN at her next appointment on 12/20.  Amber Warren also reported she has little time for herself due to taking care of her young son and does not have any parents or in-laws in the area to help. She can ask a friend to watch her son but has not done so this far.

## 2017-10-03 ENCOUNTER — Encounter: Payer: Self-pay | Admitting: Student

## 2017-10-11 NOTE — Telephone Encounter (Signed)
[employer]ia [employer] Jesus [phone]#[id number] pt informed of appointment. Lamonte SakaiZimmerman Rumple, April D, [place]

## 2017-10-12 ENCOUNTER — Encounter (HOSPITAL_COMMUNITY): Payer: Self-pay

## 2017-10-13 ENCOUNTER — Ambulatory Visit: Payer: Self-pay | Admitting: Psychology

## 2017-10-25 ENCOUNTER — Ambulatory Visit (INDEPENDENT_AMBULATORY_CARE_PROVIDER_SITE_OTHER): Payer: Self-pay | Admitting: Student

## 2017-10-25 ENCOUNTER — Other Ambulatory Visit: Payer: Self-pay

## 2017-10-25 ENCOUNTER — Encounter: Payer: Self-pay | Admitting: Student

## 2017-10-25 VITALS — BP 92/64 | HR 79 | Temp 97.6°F | Wt 170.8 lb

## 2017-10-25 DIAGNOSIS — Z3482 Encounter for supervision of other normal pregnancy, second trimester: Secondary | ICD-10-CM

## 2017-10-25 NOTE — Patient Instructions (Signed)

## 2017-10-25 NOTE — Progress Notes (Signed)
Amber KatzOfelia Warren is a 27 y.o. G2P1001 at 7883w6d for routine follow up.  She reports fetal movement.  She denies contraction, vaginal bleeding, vaginal discharge, dysuria, gush or leak of fluid.  She [facility] 2 weeks [facility]. She was told to be at 23 weeks of gestation based on that ultrasound.  We have not received the results of this ultrasound yet.There is some concern about depression.  She reports feeling better.  [facility]   See flow sheet for details. Vitals:   10/25/17 0945  BP: 92/64  Pulse: 79  Temp: 97.6 F (36.4 C)  Weight: 170 lb 12.8 oz (77.5 kg)   A/P: Pregnancy at 8683w6d.  Doing well.   Pregnancy issues include:  Depression: doing well. [facility]   Genetic screening: She is past due for quad screen at this time.  She did not return for a follow-up last month.   [facility].  We have not received the results of an ultrasound yet.  Follow-up in 4 weeks.  Needs GTT at that time.  Preterm labor symptoms and general obstetric precautions including but not limited to vaginal bleeding, contractions, leaking of fluid and fetal movement were reviewed in detail with the patient. Please refer to After Visit Summary for other counseling recommendations.   Video interpreter was ID [phone] was used for this encounter.

## 2017-10-27 ENCOUNTER — Telehealth: Payer: Self-pay | Admitting: Student

## 2017-10-27 NOTE — Telephone Encounter (Signed)
Anatomy ultrasound normal.  There is a mention of IUGR but that was based on LMP which is not reliable.  Gestational age by early ultrasound, anatomy ultrasound and [place] corresponding.  Discussed this with Dr. Lum BabeEniola.

## 2017-11-06 ENCOUNTER — Encounter: Payer: Self-pay | Admitting: Internal Medicine

## 2017-11-09 ENCOUNTER — Telehealth: Payer: Self-pay | Admitting: Internal Medicine

## 2017-11-09 LAB — CYSTIC FIBROSIS DIAGNOSTIC STUDY: INTERPRETATION-CFDNA: NEGATIVE

## 2017-11-09 LAB — OB RESULTS CONSOLE ANTIBODY SCREEN: Antibody Screen: NEGATIVE

## 2017-11-09 LAB — OB RESULTS CONSOLE HEPATITIS B SURFACE ANTIGEN: Hepatitis B Surface Ag: NEGATIVE

## 2017-11-09 LAB — GLUCOSE, 1 HOUR: Glucose 1 Hour: 121

## 2017-11-09 LAB — CYTOLOGY - PAP: PAP SMEAR: NEGATIVE

## 2017-11-09 NOTE — Telephone Encounter (Signed)
Health Dept calling requesting the report from this pt's last ultrasound in December. She is faxing over a request, but wanted me to let yall know she needs this asap. She asked that it would be faxed back to her today, but I told her it may not be able to be done today.

## 2017-11-10 NOTE — Telephone Encounter (Signed)
Please fax a copy of patient's ultrasound from [place] [facility]. Thanks!   Marcy Sirenatherine Io Dieujuste, D.O. 11/10/2017, 9:37 AM PGY-3,  Family Medicine

## 2017-11-13 NOTE — Telephone Encounter (Signed)
Ultrasound placed in fax pile. Jazmin Hartsell,CMA

## 2017-11-16 ENCOUNTER — Encounter (HOSPITAL_COMMUNITY): Payer: Self-pay | Admitting: Internal Medicine

## 2017-11-16 ENCOUNTER — Other Ambulatory Visit: Payer: Self-pay | Admitting: Obstetrics & Gynecology

## 2017-11-16 DIAGNOSIS — Z3689 Encounter for other specified antenatal screening: Secondary | ICD-10-CM

## 2017-11-16 DIAGNOSIS — Z3A25 25 weeks gestation of pregnancy: Secondary | ICD-10-CM

## 2017-11-16 DIAGNOSIS — O365920 Maternal care for other known or suspected poor fetal growth, second trimester, not applicable or unspecified: Secondary | ICD-10-CM

## 2017-11-23 ENCOUNTER — Ambulatory Visit (HOSPITAL_COMMUNITY)
Admission: RE | Admit: 2017-11-23 | Discharge: 2017-11-23 | Disposition: A | Discharge status: Home / Self Care | Payer: Self-pay | Source: Ambulatory Visit | Attending: Obstetrics & Gynecology | Admitting: Obstetrics & Gynecology

## 2017-11-23 ENCOUNTER — Encounter (HOSPITAL_COMMUNITY): Payer: Self-pay

## 2017-11-23 ENCOUNTER — Other Ambulatory Visit: Payer: Self-pay | Admitting: Obstetrics & Gynecology

## 2017-11-23 ENCOUNTER — Ambulatory Visit (HOSPITAL_COMMUNITY): Admission: RE | Admit: 2017-11-23 | Payer: Self-pay | Source: Ambulatory Visit

## 2017-11-23 DIAGNOSIS — O99212 Obesity complicating pregnancy, second trimester: Secondary | ICD-10-CM

## 2017-11-23 DIAGNOSIS — O34219 Maternal care for unspecified type scar from previous cesarean delivery: Secondary | ICD-10-CM

## 2017-11-23 DIAGNOSIS — Z3689 Encounter for other specified antenatal screening: Secondary | ICD-10-CM

## 2017-11-23 DIAGNOSIS — Z3A25 25 weeks gestation of pregnancy: Secondary | ICD-10-CM

## 2017-11-23 DIAGNOSIS — Z363 Encounter for antenatal screening for malformations: Secondary | ICD-10-CM

## 2017-11-23 DIAGNOSIS — O365920 Maternal care for other known or suspected poor fetal growth, second trimester, not applicable or unspecified: Secondary | ICD-10-CM | POA: Insufficient documentation

## 2017-11-23 DIAGNOSIS — O09892 Supervision of other high risk pregnancies, second trimester: Secondary | ICD-10-CM | POA: Insufficient documentation

## 2017-11-23 DIAGNOSIS — E669 Obesity, unspecified: Secondary | ICD-10-CM | POA: Insufficient documentation

## 2017-12-14 LAB — OB RESULTS CONSOLE HGB/HCT, BLOOD
HEMATOCRIT: 33
HEMOGLOBIN: 10.9

## 2017-12-14 LAB — OB RESULTS CONSOLE RPR: RPR: NONREACTIVE

## 2017-12-14 LAB — GLUCOSE, 1 HOUR: GLUCOSE 1 HOUR: 102

## 2017-12-14 LAB — HIV ANTIBODY (ROUTINE TESTING W REFLEX): HIV Screen 4th Generation wRfx: NONREACTIVE

## 2017-12-15 ENCOUNTER — Encounter (HOSPITAL_COMMUNITY): Payer: Self-pay | Admitting: *Deleted

## 2017-12-15 ENCOUNTER — Inpatient Hospital Stay (HOSPITAL_COMMUNITY)
Admission: AD | Admit: 2017-12-15 | Discharge: 2017-12-15 | Disposition: A | Discharge status: Home / Self Care | Payer: Medicaid Other | Source: Ambulatory Visit | Attending: Obstetrics and Gynecology | Admitting: Obstetrics and Gynecology

## 2017-12-15 ENCOUNTER — Other Ambulatory Visit: Payer: Self-pay

## 2017-12-15 DIAGNOSIS — R12 Heartburn: Secondary | ICD-10-CM

## 2017-12-15 DIAGNOSIS — R109 Unspecified abdominal pain: Secondary | ICD-10-CM | POA: Diagnosis not present

## 2017-12-15 DIAGNOSIS — O26899 Other specified pregnancy related conditions, unspecified trimester: Secondary | ICD-10-CM

## 2017-12-15 DIAGNOSIS — O26893 Other specified pregnancy related conditions, third trimester: Secondary | ICD-10-CM | POA: Insufficient documentation

## 2017-12-15 DIAGNOSIS — Z3A28 28 weeks gestation of pregnancy: Secondary | ICD-10-CM | POA: Insufficient documentation

## 2017-12-15 LAB — URINALYSIS, ROUTINE W REFLEX MICROSCOPIC
Bilirubin Urine: NEGATIVE
GLUCOSE, UA: NEGATIVE mg/dL
Hgb urine dipstick: NEGATIVE
Ketones, ur: NEGATIVE mg/dL
LEUKOCYTES UA: NEGATIVE
NITRITE: NEGATIVE
PH: 6 (ref 5.0–8.0)
Protein, ur: NEGATIVE mg/dL
SPECIFIC GRAVITY, URINE: 1.018 (ref 1.005–1.030)

## 2017-12-15 MED ORDER — FAMOTIDINE 20 MG PO TABS: 20.0000 mg | ORAL_TABLET | Freq: Two times a day (BID) | ORAL | 2 refills | Status: DC

## 2017-12-15 MED ORDER — GI COCKTAIL ~~LOC~~
30.0000 mL | Freq: Once | ORAL | Status: AC
  Administered 2017-12-15: 30 mL via ORAL
  Filled 2017-12-15: qty 30

## 2017-12-15 NOTE — MAU Provider Note (Signed)
History     CSN: [id number]  Arrival date and time: 12/15/17 4098   First Provider Initiated Contact with Patient 12/15/17 1942     Chief Complaint  Patient presents with  . Abdominal Pain  . Back Pain   HPI Amber Warren is a 27 y.o. G2P1001 at [redacted]w[redacted]d who presents with upper and lower abdominal pain that started yesterday. She states the pain comes and goes and is worse with food. She reports having increased heartburn during the pregnancy but has not tried anything for it. She denies any vaginal bleeding or leaking of fluid. Denies abnormal discharge. Reports good fetal movement. Denies any history of preterm labor.   OB History    Gravida Para Term Preterm AB Living   2 1 1  0 0 1   SAB TAB Ectopic Multiple Live Births   0 0 0 0 1      Past Medical History:  Diagnosis Date  . Anemia   . Dysmenorrhea   . Obesity (BMI 30-39.9)   . PCO (polycystic ovaries)     Past Surgical History:  Procedure Laterality Date  . CESAREAN SECTION N/A 10/02/2014   Procedure: CESAREAN SECTION;  Surgeon: Adam Phenix, MD;  Location: [facility];  Service: Obstetrics;  Laterality: N/A;  . NO PAST SURGERIES      No family history on file.  Social History   Tobacco Use  . Smoking status: Never Smoker  . Smokeless tobacco: Never Used  Substance Use Topics  . Alcohol use: No  . Drug use: No    Allergies: No Known Allergies  Medications Prior to Admission  Medication Sig Dispense Refill Last Dose  . acetaminophen (TYLENOL 8 HOUR) 650 MG CR tablet Take 1 tablet (650 mg total) every 8 (eight) hours as needed by mouth for pain. 30 tablet 0 Taking  . Prenatal Vit-Fe Fumarate-FA (MULTIVITAMIN-PRENATAL) 27-0.8 MG TABS tablet Take 1 tablet by mouth daily at 12 noon.   Taking  . vitamin B-6 (PYRIDOXINE) 25 MG tablet Take 1 tablet (25 mg total) daily by mouth. (Patient not taking: Reported on 11/23/2017) 30 tablet 0 Not Taking    Review of Systems  Constitutional: Negative.  Negative  for fatigue and fever.  HENT: Negative.   Respiratory: Negative.  Negative for shortness of breath.   Cardiovascular: Negative.  Negative for chest pain.  Gastrointestinal: Positive for abdominal pain. Negative for constipation, diarrhea, nausea and vomiting.  Genitourinary: Negative.  Negative for dysuria, vaginal bleeding and vaginal discharge.  Neurological: Negative.  Negative for dizziness and headaches.   Physical Exam   Blood pressure 104/65, pulse 78, temperature 97.6 F (36.4 C), resp. rate 18, height 4\' 9"  (1.448 m), weight 179 lb (81.2 kg), last menstrual period 04/30/2017, currently breastfeeding.  Physical Exam  Nursing note and vitals reviewed. Constitutional: She is oriented to person, place, and time. She appears well-developed and well-nourished. No distress.  HENT:  Head: Normocephalic.  Eyes: Pupils are equal, round, and reactive to light.  Cardiovascular: Normal rate, regular rhythm and normal heart sounds.  Respiratory: Effort normal and breath sounds normal. No respiratory distress.  GI: Soft. Bowel sounds are normal. She exhibits no distension. There is no tenderness.  Neurological: She is alert and oriented to person, place, and time.  Skin: Skin is warm and dry.  Psychiatric: She has a normal mood and affect. Her behavior is normal. Judgment and thought content normal.   Dilation: Closed Effacement (%): Thick Exam by:: Jerald Kief., cnm  Fetal Tracing:  Baseline: 120 Variability: moderate Accels: 10x10, 15x15 Decels: none   Toco: none  MAU Course  Procedures Results for orders placed or performed during the hospital encounter of 12/15/17 (from the past 24 hour(s))  Urinalysis, Routine w reflex microscopic     Status: None   Collection Time: 12/15/17  7:20 PM  Result Value Ref Range   Color, Urine YELLOW YELLOW   APPearance CLEAR CLEAR   Specific Gravity, Urine 1.018 1.005 - 1.030   pH 6.0 5.0 - 8.0   Glucose, UA NEGATIVE NEGATIVE mg/dL   Hgb  urine dipstick NEGATIVE NEGATIVE   Bilirubin Urine NEGATIVE NEGATIVE   Ketones, ur NEGATIVE NEGATIVE mg/dL   Protein, ur NEGATIVE NEGATIVE mg/dL   Nitrite NEGATIVE NEGATIVE   Leukocytes, UA NEGATIVE NEGATIVE    MDM UA GI cocktail Low suspicion for preterm labor due to lack of contractions and resolution of pain with GI cocktail  Assessment and Plan   1. Heartburn during pregnancy in third trimester   2. [redacted] weeks gestation of pregnancy   3. Abdominal pain affecting pregnancy    -Discharge home in stable condition -Rx for pepcid sent to patient's pharmacy -Preterm labor precautions discussed -Patient advised to follow-up with [facility] as scheduled for prenatal care -Patient may return to MAU as needed or if her condition were to change or worsen  Rolm BookbinderCaroline M Neill CNM 12/15/2017, 7:50 PM

## 2017-12-15 NOTE — MAU Note (Signed)
Yesterday morning started having pain in upper abd, lower abd, and lower back which has continued to day. Stomach gets hard at times. Some nausea but denies vomiting or diarrhea. Urinating more than usual. Denies dysuria. No LOF or bleeding

## 2017-12-15 NOTE — Discharge Instructions (Signed)
Heartburn During Pregnancy °Heartburn is a type of pain or discomfort that can happen in the throat or chest. It is often described as a burning sensation. Heartburn is common during pregnancy because: °· A hormone (progesterone) that is released during pregnancy may relax the valve (lower esophageal sphincter, or LES) that separates the esophagus from the stomach. This allows stomach acid to move up into the esophagus, causing heartburn. °· The uterus gets larger and pushes up on the stomach, which pushes more acid into the esophagus. This is especially true in the later stages of pregnancy. ° °Heartburn usually goes away or gets better after giving birth. °What are the causes? °Heartburn is caused by stomach acid backing up into the esophagus (reflux). Reflux can be triggered by: °· Changing hormone levels. °· Large meals. °· Certain foods and beverages, such as coffee, chocolate, onions, and peppermint. °· Exercise. °· Increased stomach acid production. ° °What increases the risk? °You are more likely to experience heartburn during pregnancy if you: °· Had heartburn prior to becoming pregnant. °· Have been pregnant more than once before. °· Are overweight or obese. ° °The likelihood that you will get heartburn also increases as you get farther along in your pregnancy, especially during the last trimester. °What are the signs or symptoms? °Symptoms of this condition include: °· Burning pain in the chest or lower throat. °· Bitter taste in the mouth. °· Coughing. °· Problems swallowing. °· Vomiting. °· Hoarse voice. °· Asthma. ° °Symptoms may get worse when you lie down or bend over. Symptoms are often worse at night. °How is this diagnosed? °This condition is diagnosed based on: °· Your medical history. °· Your symptoms. °· Blood tests to check for a certain type of bacteria associated with heartburn. °· Whether taking heartburn medicine relieves your symptoms. °· Examination of the stomach and esophagus using a  tube with a light and camera on the end (endoscopy). ° °How is this treated? °Treatment varies depending on how severe your symptoms are. Your health care provider may recommend: °· Over-the-counter medicines (antacids or acid reducers) for mild heartburn. °· Prescription medicines to decrease stomach acid or to protect your stomach lining. °· Certain changes in your diet. °· Raising the head of your bed so it is higher than the foot of the bed. This helps prevent stomach acid from backing up into the esophagus when you are lying down. ° °Follow these instructions at home: °Eating and drinking °· Do not drink alcohol during your pregnancy. °· Identify foods and beverages that make your symptoms worse, and avoid them. °· Beverages that you may want to avoid include: °? Coffee and tea (with or without caffeine). °? Energy drinks and sports drinks. °? Carbonated drinks or sodas. °? Citrus fruit juices. °· Foods that you may want to avoid include: °? Chocolate and cocoa. °? Peppermint and mint flavorings. °? Garlic, onions, and horseradish. °? Spicy and acidic foods, including peppers, chili powder, curry powder, vinegar, hot sauces, and barbecue sauce. °? Citrus fruits, such as oranges, lemons, and limes. °? Tomato-based foods, such as red sauce, chili, and salsa. °? Fried and fatty foods, such as donuts, french fries, potato chips, and high-fat dressings. °? High-fat meats, such as hot dogs, cold cuts, sausage, ham, and bacon. °? High-fat dairy items, such as whole milk, butter, and cheese. °· Eat small, frequent meals instead of large meals. °· Avoid drinking large amounts of liquid with your meals. °· Avoid eating meals during the 2-3 hours before   bedtime. °· Avoid lying down right after you eat. °· Do not exercise right after you eat. °Medicines °· Take over-the-counter and prescription medicines only as told by your health care provider. °· Do not take aspirin, ibuprofen, or other NSAIDs unless your health care  provider tells you to do that. °· You may be instructed to avoid medicines that contain sodium bicarbonate. °General instructions °· If directed, raise the head of your bed about 6 inches (15 cm) by putting blocks under the legs. Sleeping with more pillows does not effectively relieve heartburn because it only changes the position of your head. °· Do not use any products that contain nicotine or tobacco, such as cigarettes and e-cigarettes. If you need help quitting, ask your health care provider. °· Wear loose-fitting clothing. °· Try to reduce your stress, such as with yoga or meditation. If you need help managing stress, ask your health care provider. °· Maintain a healthy weight. If you are overweight, work with your health care provider to safely lose weight. °· Keep all follow-up visits as told by your health care provider. This is important. °Contact a health care provider if: °· You develop new symptoms. °· Your symptoms do not improve with treatment. °· You have unexplained weight loss. °· You have difficulty swallowing. °· You make loud sounds when you breathe (wheeze). °· You have a cough that does not go away. °· You have frequent heartburn for more than 2 weeks. °· You have nausea or vomiting that does not get better with treatment. °· You have pain in your abdomen. °Get help right away if: °· You have severe chest pain that spreads to your arm, neck, or jaw. °· You feel sweaty, dizzy, or light-headed. °· You have shortness of breath. °· You have pain when swallowing. °· You vomit, and your vomit looks like blood or coffee grounds. °· Your stool is bloody or black. °This information is not intended to replace advice given to you by your health care provider. Make sure you discuss any questions you have with your health care provider. °Document Released: 10/07/2000 Document Revised: 06/27/2016 Document Reviewed: 06/27/2016 °Elsevier Interactive Patient Education © 2018 [employer] ° °

## 2018-01-31 LAB — OB RESULTS CONSOLE GBS: GBS: NEGATIVE

## 2018-01-31 LAB — OB RESULTS CONSOLE GC/CHLAMYDIA
Chlamydia: NEGATIVE
Gonorrhea: NEGATIVE

## 2018-02-07 ENCOUNTER — Encounter: Payer: Self-pay | Admitting: *Deleted

## 2018-02-08 ENCOUNTER — Ambulatory Visit: Payer: Self-pay | Admitting: *Deleted

## 2018-02-08 ENCOUNTER — Ambulatory Visit: Payer: Self-pay

## 2018-02-08 ENCOUNTER — Telehealth (HOSPITAL_COMMUNITY): Payer: Self-pay | Admitting: *Deleted

## 2018-02-08 VITALS — BP 102/55 | HR 91

## 2018-02-08 DIAGNOSIS — O26613 Liver and biliary tract disorders in pregnancy, third trimester: Principal | ICD-10-CM

## 2018-02-08 DIAGNOSIS — O26643 Intrahepatic cholestasis of pregnancy, third trimester: Secondary | ICD-10-CM

## 2018-02-08 DIAGNOSIS — K831 Obstruction of bile duct: Secondary | ICD-10-CM

## 2018-02-08 MED ORDER — URSODIOL 300 MG PO CAPS: 300.0000 mg | ORAL_CAPSULE | Freq: Three times a day (TID) | ORAL | 0 refills | Status: DC

## 2018-02-08 MED FILL — URSODIOL 300 MG CAPSULE: 300 | 7 days supply | Qty: 21 | Fill #0

## 2018-02-08 NOTE — Progress Notes (Signed)
Interpreter Amber Breslowarol Warren present for encounter. [facility]. She states that she is not taking Ursodiol because she was not given Rx and her pharmacy also had not received Rx. [facility] due to lower [facility]. Pt agrees to obtain today. IOL scheduled on 4/25 per guideline. Pt has scheduled appt on 4/22 to see Dr. Vergie LivingPickens.

## 2018-02-09 ENCOUNTER — Other Ambulatory Visit: Payer: Self-pay | Admitting: Advanced Practice Midwife

## 2018-02-12 ENCOUNTER — Encounter: Payer: Self-pay | Admitting: Obstetrics and Gynecology

## 2018-02-12 ENCOUNTER — Other Ambulatory Visit: Payer: Self-pay

## 2018-02-12 ENCOUNTER — Ambulatory Visit (INDEPENDENT_AMBULATORY_CARE_PROVIDER_SITE_OTHER): Payer: Self-pay | Admitting: Obstetrics and Gynecology

## 2018-02-12 DIAGNOSIS — K831 Obstruction of bile duct: Secondary | ICD-10-CM | POA: Insufficient documentation

## 2018-02-12 DIAGNOSIS — Z789 Other specified health status: Secondary | ICD-10-CM

## 2018-02-12 DIAGNOSIS — Z758 Other problems related to medical facilities and other health care: Secondary | ICD-10-CM | POA: Insufficient documentation

## 2018-02-12 DIAGNOSIS — O26613 Liver and biliary tract disorders in pregnancy, third trimester: Secondary | ICD-10-CM

## 2018-02-12 NOTE — Telephone Encounter (Signed)
Preadmission screen [phone] interpreter number

## 2018-02-12 NOTE — Progress Notes (Addendum)
Prenatal Visit Note Transfer of Care Visit  ([facility]) due to cholestasis of pregnancy Date: 02/12/2018 [facility]  Subjective:  Amber LandauOfelia Warren is a 27 y.o. G2P1001 at 2935w4d being seen today for ongoing prenatal care.  She is currently monitored for the following issues for this high-risk pregnancy and has PCO (polycystic ovaries); Oligomenorrhea; Overweight(278.02); Encounter for supervision of normal first pregnancy in second trimester; Threatened labor at term; and Depression during pregnancy on their problem list.  Patient reports no complaints.   Contractions: Irritability. Vag. Bleeding: None.  Movement: Present. Denies leaking of fluid.   The following portions of the patient's history were reviewed and updated as appropriate: allergies, current medications, past family history, past medical history, past social history, past surgical history and problem list. Problem list updated.  Objective:   Vitals:   02/12/18 1031  BP: (!) 108/58  Pulse: 76  Weight: 191 lb 12.8 oz (87 kg)    Fetal Status: Fetal Heart Rate (bpm): 121 Fundal Height: 36 cm Movement: Present  Presentation: Vertex  General:  Alert, oriented and cooperative. Patient is in no acute distress.  Skin: Skin is warm and dry. No rash noted.   Cardiovascular: Normal heart rate noted  Respiratory: Normal respiratory effort, no problems with respiration noted  Abdomen: Soft, gravid, appropriate for gestational age. Pain/Pressure: Present     Pelvic:  Cervical exam deferred Dilation: Fingertip Effacement (%): Thick Station: Ballotable  Extremities: Normal range of motion.  Edema: Trace  Mental Status: Normal mood and affect. Normal behavior. Normal judgment and thought content.   Urinalysis:      Assessment and Plan:  Pregnancy: G2P1001 at 3235w4d  Routine care. Confirms taking actigall tid. 4/18 bpp 10/10. D/w her likely cytotec iolon 4/25. LNG IUD  Preterm labor symptoms and  general obstetric precautions including but not limited to vaginal bleeding, contractions, leaking of fluid and fetal movement were reviewed in detail with the patient. Please refer to After Visit Summary for other counseling recommendations.  Return in about 1 month (around 03/14/2018) for postpartum visit.   Trinity BingPickens, Delinda Malan, MD

## 2018-02-15 ENCOUNTER — Inpatient Hospital Stay (HOSPITAL_COMMUNITY)
Admission: RE | Admit: 2018-02-15 | Discharge: 2018-02-19 | DRG: 786 | Disposition: A | Discharge status: Home / Self Care | Payer: Medicaid Other | Source: Ambulatory Visit | Attending: Obstetrics and Gynecology | Admitting: Obstetrics and Gynecology

## 2018-02-15 ENCOUNTER — Inpatient Hospital Stay (HOSPITAL_COMMUNITY): Payer: Medicaid Other | Admitting: Anesthesiology

## 2018-02-15 DIAGNOSIS — O9962 Diseases of the digestive system complicating childbirth: Secondary | ICD-10-CM | POA: Diagnosis not present

## 2018-02-15 DIAGNOSIS — O99214 Obesity complicating childbirth: Secondary | ICD-10-CM | POA: Diagnosis present

## 2018-02-15 DIAGNOSIS — O26643 Intrahepatic cholestasis of pregnancy, third trimester: Secondary | ICD-10-CM | POA: Diagnosis present

## 2018-02-15 DIAGNOSIS — Z3A37 37 weeks gestation of pregnancy: Secondary | ICD-10-CM | POA: Diagnosis not present

## 2018-02-15 DIAGNOSIS — O2662 Liver and biliary tract disorders in childbirth: Principal | ICD-10-CM | POA: Diagnosis present

## 2018-02-15 DIAGNOSIS — O9902 Anemia complicating childbirth: Secondary | ICD-10-CM | POA: Diagnosis present

## 2018-02-15 DIAGNOSIS — O26613 Liver and biliary tract disorders in pregnancy, third trimester: Secondary | ICD-10-CM

## 2018-02-15 DIAGNOSIS — D649 Anemia, unspecified: Secondary | ICD-10-CM | POA: Diagnosis present

## 2018-02-15 DIAGNOSIS — O34211 Maternal care for low transverse scar from previous cesarean delivery: Secondary | ICD-10-CM | POA: Diagnosis present

## 2018-02-15 DIAGNOSIS — K831 Obstruction of bile duct: Secondary | ICD-10-CM | POA: Diagnosis present

## 2018-02-15 LAB — RPR: RPR: NONREACTIVE

## 2018-02-15 LAB — CBC
HCT: 31.6 % — ABNORMAL LOW (ref 36.0–46.0)
Hemoglobin: 10.2 g/dL — ABNORMAL LOW (ref 12.0–15.0)
MCH: 26 pg (ref 26.0–34.0)
MCHC: 32.3 g/dL (ref 30.0–36.0)
MCV: 80.4 fL (ref 78.0–100.0)
PLATELETS: 245 10*3/uL (ref 150–400)
RBC: 3.93 MIL/uL (ref 3.87–5.11)
RDW: 15 % (ref 11.5–15.5)
WBC: 10.3 10*3/uL (ref 4.0–10.5)

## 2018-02-15 LAB — TYPE AND SCREEN
ABO/RH(D): A POS
ANTIBODY SCREEN: NEGATIVE

## 2018-02-15 MED ORDER — EPHEDRINE 5 MG/ML INJ: 10.0000 mg | INTRAVENOUS | Status: DC | PRN

## 2018-02-15 MED ORDER — OXYCODONE-ACETAMINOPHEN 5-325 MG PO TABS: 1.0000 | ORAL_TABLET | ORAL | Status: DC | PRN

## 2018-02-15 MED ORDER — LIDOCAINE HCL (PF) 1 % IJ SOLN
INTRAMUSCULAR | Status: DC | PRN
  Administered 2018-02-15: 3 mL
  Administered 2018-02-15: 2 mL
  Administered 2018-02-15: 5 mL

## 2018-02-15 MED ORDER — TERBUTALINE SULFATE 1 MG/ML IJ SOLN: 0.2500 mg | Freq: Once | INTRAMUSCULAR | Status: DC | PRN

## 2018-02-15 MED ORDER — LIDOCAINE HCL (PF) 1 % IJ SOLN: 30.0000 mL | INTRAMUSCULAR | Status: DC | PRN

## 2018-02-15 MED ORDER — LACTATED RINGERS IV SOLN
500.0000 mL | Freq: Once | INTRAVENOUS | Status: AC
  Administered 2018-02-15: 500 mL via INTRAVENOUS

## 2018-02-15 MED ORDER — FENTANYL 2.5 MCG/ML BUPIVACAINE 1/10 % EPIDURAL INFUSION (WH - ANES)
14.0000 mL/h | INTRAMUSCULAR | Status: DC | PRN
  Administered 2018-02-15 – 2018-02-16 (×7): 14 mL/h via EPIDURAL
  Filled 2018-02-15 (×7): qty 100

## 2018-02-15 MED ORDER — ACETAMINOPHEN 325 MG PO TABS: 650.0000 mg | ORAL_TABLET | ORAL | Status: DC | PRN

## 2018-02-15 MED ORDER — OXYTOCIN BOLUS FROM INFUSION: 500.0000 mL | Freq: Once | INTRAVENOUS | Status: DC

## 2018-02-15 MED ORDER — OXYTOCIN 40 UNITS IN LACTATED RINGERS INFUSION - SIMPLE MED
2.5000 [IU]/h | INTRAVENOUS | Status: DC
  Filled 2018-02-15: qty 1000

## 2018-02-15 MED ORDER — FENTANYL CITRATE (PF) 100 MCG/2ML IJ SOLN: 100.0000 ug | INTRAMUSCULAR | Status: DC | PRN

## 2018-02-15 MED ORDER — SOD CITRATE-CITRIC ACID 500-334 MG/5ML PO SOLN
30.0000 mL | ORAL | Status: DC | PRN
  Administered 2018-02-16: 30 mL via ORAL
  Filled 2018-02-15: qty 15

## 2018-02-15 MED ORDER — LACTATED RINGERS IV SOLN: 500.0000 mL | INTRAVENOUS | Status: DC | PRN

## 2018-02-15 MED ORDER — PHENYLEPHRINE 40 MCG/ML (10ML) SYRINGE FOR IV PUSH (FOR BLOOD PRESSURE SUPPORT): 80.0000 ug | PREFILLED_SYRINGE | INTRAVENOUS | Status: DC | PRN

## 2018-02-15 MED ORDER — OXYTOCIN 40 UNITS IN LACTATED RINGERS INFUSION - SIMPLE MED
1.0000 m[IU]/min | INTRAVENOUS | Status: DC
  Administered 2018-02-15: 1 m[IU]/min via INTRAVENOUS
  Administered 2018-02-15: 5 m[IU]/min via INTRAVENOUS
  Administered 2018-02-16: 32 m[IU]/min via INTRAVENOUS
  Filled 2018-02-15: qty 1000

## 2018-02-15 MED ORDER — ONDANSETRON HCL 4 MG/2ML IJ SOLN
4.0000 mg | Freq: Four times a day (QID) | INTRAMUSCULAR | Status: DC | PRN
  Administered 2018-02-16: 4 mg via INTRAVENOUS
  Filled 2018-02-15: qty 2

## 2018-02-15 MED ORDER — EPHEDRINE 5 MG/ML INJ
10.0000 mg | INTRAVENOUS | Status: DC | PRN
  Filled 2018-02-15: qty 4

## 2018-02-15 MED ORDER — OXYCODONE-ACETAMINOPHEN 5-325 MG PO TABS: 2.0000 | ORAL_TABLET | ORAL | Status: DC | PRN

## 2018-02-15 MED ORDER — PHENYLEPHRINE 40 MCG/ML (10ML) SYRINGE FOR IV PUSH (FOR BLOOD PRESSURE SUPPORT)
80.0000 ug | PREFILLED_SYRINGE | INTRAVENOUS | Status: DC | PRN
  Filled 2018-02-15 (×2): qty 10

## 2018-02-15 MED ORDER — DIPHENHYDRAMINE HCL 50 MG/ML IJ SOLN: 12.5000 mg | INTRAMUSCULAR | Status: DC | PRN

## 2018-02-15 MED ORDER — LACTATED RINGERS IV SOLN: 500.0000 mL | Freq: Once | INTRAVENOUS | Status: DC

## 2018-02-15 MED ORDER — MISOPROSTOL 25 MCG QUARTER TABLET: 25.0000 ug | ORAL_TABLET | ORAL | Status: DC | PRN

## 2018-02-15 MED ORDER — LACTATED RINGERS IV SOLN
INTRAVENOUS | Status: DC
  Administered 2018-02-15 – 2018-02-16 (×4): via INTRAVENOUS

## 2018-02-15 NOTE — Anesthesia Procedure Notes (Signed)
Epidural Patient location during procedure: OB Start time: 02/15/2018 5:22 PM End time: 02/15/2018 5:32 PM  Staffing Anesthesiologist: Marcene DuosFitzgerald, Courtenay Hirth, MD Performed: anesthesiologist   Preanesthetic Checklist Completed: patient identified, site marked, surgical consent, pre-op evaluation, timeout performed, IV checked, risks and benefits discussed and monitors and equipment checked  Epidural Patient position: sitting Prep: site prepped and draped and DuraPrep Patient monitoring: continuous pulse ox and blood pressure Approach: midline Location: L4-L5 Injection technique: LOR air  Needle:  Needle type: Tuohy  Needle gauge: 17 G Needle length: 9 cm and 9 Needle insertion depth: 9 cm Catheter type: closed end flexible Catheter size: 19 Gauge Catheter at skin depth: 17 cm Test dose: negative  Assessment Events: blood not aspirated, injection not painful, no injection resistance, negative IV test and no paresthesia

## 2018-02-15 NOTE — Progress Notes (Signed)
Patient Vitals for the past 4 hrs:  BP Temp Temp src Pulse Resp SpO2  02/15/18 2205 113/60 - - 78 18 99 %  02/15/18 2200 (!) 111/51 - - 79 18 99 %  02/15/18 2155 108/63 - - 87 18 98 %  02/15/18 2150 (!) 115/59 - - 81 18 100 %  02/15/18 2145 (!) 114/57 - - 88 18 98 %  02/15/18 2140 112/62 - - 84 18 -  02/15/18 2135 92/80 - - 83 - -  02/15/18 2132 116/60 - - 77 17 -  02/15/18 2100 (!) 127/48 - - 76 18 -  02/15/18 2030 114/60 - - 84 18 -  02/15/18 2000 123/72 98.4 F (36.9 C) Axillary 95 18 -  02/15/18 1930 (!) 92/46 - - 83 18 -  02/15/18 1900 (!) 100/57 - - 76 - -  02/15/18 1857 - - - - 18 -  02/15/18 1830 (!) 123/59 - - 79 16 -   Foley fell out around 2115.  Cx 5/50/ballottable.FHR Cat 1 Ctx q 2-5 mintues.  Pitocin at 8 mu/min.  Will increase pitocin per protocol.

## 2018-02-15 NOTE — H&P (Signed)
LABOR AND DELIVERY ADMISSION HISTORY AND PHYSICAL NOTE  Charlottie Peragine is a 27 y.o. female G2P1001 with IUP at [redacted]w[redacted]d by LMP presenting for IOL for cholestasis- TOLAC, previous c/s for failure to progress.  She reports positive fetal movement. She denies leakage of fluid or vaginal bleeding. Patient reports intermittent cramping that are not painful. Spanish interpreter at bedside.   Prenatal History/Complications: [facility] transfer from [facility] on 02/08/18 Pregnancy complications:  - Cholestasis during pregnancy, third trimester   Past Medical History: Past Medical History:  Diagnosis Date  . Anemia   . Dysmenorrhea   . Obesity (BMI 30-39.9)   . PCO (polycystic ovaries)     Past Surgical History: Past Surgical History:  Procedure Laterality Date  . CESAREAN SECTION N/A 10/02/2014   Procedure: CESAREAN SECTION;  Surgeon: Adam Phenix, MD;  Location: [facility];  Service: Obstetrics;  Laterality: N/A;  . NO PAST SURGERIES      Obstetrical History: OB History    Gravida  2   Para  1   Term  1   Preterm  0   AB  0   Living  1     SAB  0   TAB  0   Ectopic  0   Multiple  0   Live Births  1           Social History: Social History   Socioeconomic History  . Marital status: Married    Spouse name: Not on file  . Number of children: Not on file  . Years of education: Not on file  . Highest education level: Not on file  Occupational History  . Not on file  Social Needs  . Financial resource strain: Not on file  . Food insecurity:    Worry: Not on file    Inability: Not on file  . Transportation needs:    Medical: Not on file    Non-medical: Not on file  Tobacco Use  . Smoking status: Never Smoker  . Smokeless tobacco: Never Used  Substance and Sexual Activity  . Alcohol use: No  . Drug use: No  . Sexual activity: Not Currently  Lifestyle  . Physical activity:    Days per week: Not on file    Minutes per session: Not on file  . Stress:  Not on file  Relationships  . Social connections:    Talks on phone: Not on file    Gets together: Not on file    Attends religious service: Not on file    Active member of club or organization: Not on file    Attends meetings of clubs or organizations: Not on file    Relationship status: Not on file  Other Topics Concern  . Not on file  Social History Narrative   ** Merged History Encounter **        Family History: No family history on file.  Allergies: No Known Allergies  Medications Prior to Admission  Medication Sig Dispense Refill Last Dose  . diphenhydrAMINE (BENADRYL) 25 MG tablet Take 25 mg by mouth every 6 (six) hours as needed.   Past Week at Unknown time  . famotidine (PEPCID) 20 MG tablet Take 1 tablet (20 mg total) by mouth 2 (two) times daily. 30 tablet 2 02/14/2018 at Unknown time  . Prenatal Vit-Fe Fumarate-FA (MULTIVITAMIN-PRENATAL) 27-0.8 MG TABS tablet Take 1 tablet by mouth daily at 12 noon.   Past Month at Unknown time  . ursodiol (ACTIGALL) 300  MG capsule Take 1 capsule (300 mg total) by mouth 3 (three) times daily. 21 capsule 0 02/14/2018 at Unknown time  . vitamin B-6 (PYRIDOXINE) 25 MG tablet Take 1 tablet (25 mg total) daily by mouth. (Patient not taking: Reported on 11/23/2017) 30 tablet 0 Not Taking at Unknown time     Review of Systems  All systems reviewed and negative except as stated in HPI  Physical Exam Blood pressure 110/77, pulse 70, temperature 98.6 F (37 C), temperature source Oral, resp. rate 16, height 4\' 8"  (1.422 m), weight 191 lb (86.6 kg), last menstrual period 04/30/2017, currently breastfeeding. General appearance: alert, cooperative and no distress Lungs: clear to auscultation bilaterally Heart: regular rate and rhythm Abdomen: soft, non-tender; bowel sounds normal Extremities: No calf swelling or tenderness Presentation: cephalic Fetal monitoring: 135/ moderate/ + accels/ no decelerations  Uterine activity: Occasional mild  contractions  Dilation: Fingertip Exam by:: Waverly Chavarria, cnm  Prenatal labs: ABO, Rh: --/--/A POS (04/25 0749) Antibody: NEG (04/25 0749) Rubella: 6.48 (10/10 1054) RPR: Nonreactive (02/21 0000)  HBsAg: Negative (01/17 0000)  HIV: nonreactive (02/21 0000)  GC/Chlamydia: Negative (4/10) GBS: Negative (04/10 0000)  1 hr Glucola: 102 (2/21) Genetic screening:  Negative  Anatomy US: Normal female   Prenatal Transfer Tool  Maternal Diabetes: No Genetic Screening: Declined Maternal Ultrasounds/Referrals: Normal Fetal Ultrasounds or other Referrals:  None Maternal Substance Abuse:  No Significant Maternal Medications:  None Significant Maternal Lab Results: Lab values include: Group B Strep negative  Results for orders placed or performed during the hospital encounter of 02/15/18 (from the past 24 hour(s))  CBC   Collection Time: 02/15/18  7:49 AM  Result Value Ref Range   WBC 10.3 4.0 - 10.5 K/uL   RBC 3.93 3.87 - 5.11 MIL/uL   Hemoglobin 10.2 (L) 12.0 - 15.0 g/dL   HCT 16.131.6 (L) 09.636.0 - 04.546.0 %   MCV 80.4 78.0 - 100.0 fL   MCH 26.0 26.0 - 34.0 pg   MCHC 32.3 30.0 - 36.0 g/dL   RDW 40.915.0 81.111.5 - 91.415.5 %   Platelets 245 150 - 400 K/uL  Type and screen   Collection Time: 02/15/18  7:49 AM  Result Value Ref Range   ABO/RH(D) A POS    Antibody Screen NEG    Sample Expiration      02/18/2018 Performed at [facility], [address] [place], [place] [id number]     Patient Active Problem List   Diagnosis Date Noted  . Cholestasis during pregnancy in third trimester 02/12/2018  . Language barrier 02/12/2018  . 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 Threatened labor at term 10/01/2014  . Supervision of high risk pregnancy, antepartum, third trimester 02/19/2014  . Overweight(278.02) 03/29/2012  . PCO (polycystic ovaries) 05/24/2011  . Oligomenorrhea 05/24/2011    Assessment: Nadeen LandauOfelia Alarcon-Bautista is a 27 y.o. G2P1001 at 5589w0d here for IOL for cholestasis   #Labor: IOL  with pitocin due to patient being previous C/S, will attempt FB again in 4 hours after pitocin initiation  #Pain: Medications ordered PRN  #FWB: Cat I #ID:  GBS neg #MOF: Breast/Bottle  #MOC:Unsure  #Circ:  No   Sharyon CableRogers, Braydan Marriott C, CNM 02/15/2018, 12:16 PM

## 2018-02-15 NOTE — Progress Notes (Signed)
LABOR PROGRESS NOTE  Amber Warren is a 27 y.o. G2P1001 at 3057w0d admitted for IOL for cholestasis. Previous C/S- TOLAC.   Subjective: Patient reports cramping and contractions- plans on epidural   Objective: BP 103/65   Pulse 79   Temp 98.5 F (36.9 C) (Oral)   Resp 18   Ht 4\' 8"  (1.422 m)   Wt 191 lb (86.6 kg)   LMP 04/30/2017   BMI 42.82 kg/m  or  Vitals:   02/15/18 1600 02/15/18 1601 02/15/18 1634 02/15/18 1642  BP:  117/61  103/65  Pulse:  72  79  Resp:   18   Temp: 98.5 F (36.9 C)     TempSrc: Oral     Weight:      Height:        FB placed @ 1556 Dilation: Fingertip Presentation: Vertex Exam by:: Amber Warren FHT: baseline rate 130, moderate varibility, +acel, no decel Toco: 2-5 minutes   Labs: Lab Results  Component Value Date   WBC 10.3 02/15/2018   HGB 10.2 (L) 02/15/2018   HCT 31.6 (L) 02/15/2018   MCV 80.4 02/15/2018   PLT 245 02/15/2018    Patient Active Problem List   Diagnosis Date Noted  . Cholestasis during pregnancy in third trimester 02/12/2018  . Language barrier 02/12/2018  . 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 Threatened labor at term 10/01/2014  . Supervision of high risk pregnancy, antepartum, third trimester 02/19/2014  . Overweight(278.02) 03/29/2012  . PCO (polycystic ovaries) 05/24/2011  . Oligomenorrhea 05/24/2011    Assessment / Plan: 27 y.o. G2P1001 at 357w0d here for IOL for Cholestasis   Labor: FB placed @1556 , continue low dose pitocin until foley bulb is out  Fetal Wellbeing:  Cat I Pain Control:  IV Fentanyl- plans epidural  Anticipated MOD:  SVD  Amber Warren 02/15/2018, 4:24 PM

## 2018-02-15 NOTE — Anesthesia Pain Management Evaluation Note (Signed)
  CRNA Pain Management Visit Note  Patient: Amber Warren, 27 y.o., female  "Hello I am a member of the anesthesia team at [facility]. We have an anesthesia team available at all times to provide care throughout the hospital, including epidural management and anesthesia for C-section. I don't know your plan for the delivery whether it a natural birth, water birth, IV sedation, nitrous supplementation, doula or epidural, but we want to meet your pain goals."   1.Was your pain managed to your expectations on prior hospitalizations?   No, never got pain relief from epidural 3 yrs ago, had to give patient a spinal for  C.S.  2.What is your expectation for pain management during this hospitalization?     Epidural  3.How can we help you reach that goal?Make sure epidural works well in the first hour.  Record the patient's initial score and the patient's pain goal.   Pain: 3  Pain Goal: 6 [facility] wants you to be able to say your pain was always managed very well.  Lacharles Altschuler 02/15/2018

## 2018-02-15 NOTE — Anesthesia Preprocedure Evaluation (Signed)
Anesthesia Evaluation  Patient identified by MRN, date of birth, ID band Patient awake    Reviewed: Allergy & Precautions, Patient's Chart, lab work & pertinent test results  Airway Mallampati: III  TM Distance: >3 FB     Dental   Pulmonary neg pulmonary ROS,    Pulmonary exam normal        Cardiovascular negative cardio ROS Normal cardiovascular exam     Neuro/Psych negative neurological ROS     GI/Hepatic negative GI ROS, Neg liver ROS,   Endo/Other  Morbid obesity  Renal/GU negative Renal ROS     Musculoskeletal   Abdominal   Peds  Hematology  (+) anemia ,   Anesthesia Other Findings   Reproductive/Obstetrics (+) Pregnancy Previous c/s for tolac.                             Lab Results  Component Value Date   WBC 10.3 02/15/2018   HGB 10.2 (L) 02/15/2018   HCT 31.6 (L) 02/15/2018   MCV 80.4 02/15/2018   PLT 245 02/15/2018    Anesthesia Physical Anesthesia Plan  ASA: III  Anesthesia Plan: Epidural   Post-op Pain Management:    Induction:   PONV Risk Score and Plan: Treatment may vary due to age or medical condition  Airway Management Planned: Natural Airway  Additional Equipment:   Intra-op Plan:   Post-operative Plan:   Informed Consent: I have reviewed the patients History and Physical, chart, labs and discussed the procedure including the risks, benefits and alternatives for the proposed anesthesia with the patient or authorized representative who has indicated his/her understanding and acceptance.     Plan Discussed with:   Anesthesia Plan Comments:         Anesthesia Quick Evaluation

## 2018-02-16 ENCOUNTER — Encounter (HOSPITAL_COMMUNITY): Payer: Self-pay

## 2018-02-16 ENCOUNTER — Encounter (HOSPITAL_COMMUNITY)
Admission: RE | Disposition: A | Discharge status: Home / Self Care | Payer: Self-pay | Source: Ambulatory Visit | Attending: Obstetrics and Gynecology

## 2018-02-16 SURGERY — Surgical Case
Anesthesia: Regional | Site: Abdomen | Wound class: Clean Contaminated

## 2018-02-16 MED ORDER — DIPHENHYDRAMINE HCL 25 MG PO CAPS: 25.0000 mg | ORAL_CAPSULE | ORAL | Status: DC | PRN

## 2018-02-16 MED ORDER — DIPHENHYDRAMINE HCL 25 MG PO CAPS: 25.0000 mg | ORAL_CAPSULE | Freq: Four times a day (QID) | ORAL | Status: DC | PRN

## 2018-02-16 MED ORDER — CEFAZOLIN SODIUM-DEXTROSE 2-3 GM-%(50ML) IV SOLR
INTRAVENOUS | Status: DC | PRN
  Administered 2018-02-16: 2 g via INTRAVENOUS

## 2018-02-16 MED ORDER — DEXAMETHASONE SODIUM PHOSPHATE 4 MG/ML IJ SOLN
INTRAMUSCULAR | Status: DC | PRN
  Administered 2018-02-16: 4 mg via INTRAVENOUS

## 2018-02-16 MED ORDER — MENTHOL 3 MG MT LOZG: 1.0000 | LOZENGE | OROMUCOSAL | Status: DC | PRN

## 2018-02-16 MED ORDER — DIPHENHYDRAMINE HCL 50 MG/ML IJ SOLN: 12.5000 mg | INTRAMUSCULAR | Status: DC | PRN

## 2018-02-16 MED ORDER — SIMETHICONE 80 MG PO CHEW
80.0000 mg | CHEWABLE_TABLET | ORAL | Status: DC
  Administered 2018-02-17 – 2018-02-18 (×2): 80 mg via ORAL
  Filled 2018-02-16 (×3): qty 1

## 2018-02-16 MED ORDER — OXYTOCIN 10 UNIT/ML IJ SOLN
INTRAMUSCULAR | Status: AC
  Filled 2018-02-16: qty 4

## 2018-02-16 MED ORDER — SENNOSIDES-DOCUSATE SODIUM 8.6-50 MG PO TABS
2.0000 | ORAL_TABLET | ORAL | Status: DC
  Administered 2018-02-17 – 2018-02-18 (×2): 2 via ORAL
  Filled 2018-02-16 (×2): qty 2

## 2018-02-16 MED ORDER — LACTATED RINGERS IV SOLN
INTRAVENOUS | Status: DC
  Administered 2018-02-17 (×3): via INTRAVENOUS

## 2018-02-16 MED ORDER — DIBUCAINE 1 % RE OINT: 1.0000 "application " | TOPICAL_OINTMENT | RECTAL | Status: DC | PRN

## 2018-02-16 MED ORDER — SUCCINYLCHOLINE CHLORIDE 200 MG/10ML IV SOSY
PREFILLED_SYRINGE | INTRAVENOUS | Status: AC
  Filled 2018-02-16: qty 10

## 2018-02-16 MED ORDER — SIMETHICONE 80 MG PO CHEW
80.0000 mg | CHEWABLE_TABLET | Freq: Three times a day (TID) | ORAL | Status: DC
  Administered 2018-02-17 – 2018-02-19 (×7): 80 mg via ORAL
  Filled 2018-02-16 (×7): qty 1

## 2018-02-16 MED ORDER — IBUPROFEN 600 MG PO TABS
600.0000 mg | ORAL_TABLET | Freq: Four times a day (QID) | ORAL | Status: DC
  Administered 2018-02-17 – 2018-02-19 (×9): 600 mg via ORAL
  Filled 2018-02-16 (×9): qty 1

## 2018-02-16 MED ORDER — ONDANSETRON HCL 4 MG/2ML IJ SOLN
INTRAMUSCULAR | Status: DC | PRN
  Administered 2018-02-16: 4 mg via INTRAVENOUS

## 2018-02-16 MED ORDER — PROMETHAZINE HCL 25 MG/ML IJ SOLN: 12.5000 mg | Freq: Once | INTRAMUSCULAR | Status: DC | PRN

## 2018-02-16 MED ORDER — WITCH HAZEL-GLYCERIN EX PADS: 1.0000 "application " | MEDICATED_PAD | CUTANEOUS | Status: DC | PRN

## 2018-02-16 MED ORDER — ACETAMINOPHEN 500 MG PO TABS
1000.0000 mg | ORAL_TABLET | Freq: Four times a day (QID) | ORAL | Status: AC
  Administered 2018-02-17 (×3): 1000 mg via ORAL
  Filled 2018-02-16 (×3): qty 2

## 2018-02-16 MED ORDER — KETOROLAC TROMETHAMINE 30 MG/ML IJ SOLN: 30.0000 mg | Freq: Once | INTRAMUSCULAR | Status: DC | PRN

## 2018-02-16 MED ORDER — MEPERIDINE HCL 25 MG/ML IJ SOLN: 6.2500 mg | INTRAMUSCULAR | Status: DC | PRN

## 2018-02-16 MED ORDER — HYDROMORPHONE HCL 1 MG/ML IJ SOLN
0.2500 mg | INTRAMUSCULAR | Status: DC | PRN
Start: 2018-02-16 — End: 2018-02-16

## 2018-02-16 MED ORDER — ENOXAPARIN SODIUM 60 MG/0.6ML ~~LOC~~ SOLN
0.5000 mg/kg | SUBCUTANEOUS | Status: DC
  Administered 2018-02-17 – 2018-02-19 (×3): 45 mg via SUBCUTANEOUS
  Filled 2018-02-16 (×3): qty 0.6

## 2018-02-16 MED ORDER — KETOROLAC TROMETHAMINE 30 MG/ML IJ SOLN: 30.0000 mg | Freq: Four times a day (QID) | INTRAMUSCULAR | Status: AC | PRN

## 2018-02-16 MED ORDER — FAMOTIDINE 20 MG PO TABS
20.0000 mg | ORAL_TABLET | Freq: Two times a day (BID) | ORAL | Status: DC
  Administered 2018-02-17 – 2018-02-19 (×5): 20 mg via ORAL
  Filled 2018-02-16 (×5): qty 1

## 2018-02-16 MED ORDER — MORPHINE SULFATE (PF) 0.5 MG/ML IJ SOLN
INTRAMUSCULAR | Status: AC
  Filled 2018-02-16: qty 10

## 2018-02-16 MED ORDER — NALOXONE HCL 4 MG/10ML IJ SOLN: 1.0000 ug/kg/h | INTRAVENOUS | Status: DC | PRN

## 2018-02-16 MED ORDER — PHENYLEPHRINE 8 MG IN D5W 100 ML (0.08MG/ML) PREMIX OPTIME
INJECTION | INTRAVENOUS | Status: AC
  Filled 2018-02-16: qty 100

## 2018-02-16 MED ORDER — ONDANSETRON HCL 4 MG/2ML IJ SOLN: 4.0000 mg | Freq: Three times a day (TID) | INTRAMUSCULAR | Status: DC | PRN

## 2018-02-16 MED ORDER — PHENYLEPHRINE 8 MG IN D5W 100 ML (0.08MG/ML) PREMIX OPTIME
INJECTION | INTRAVENOUS | Status: DC | PRN
  Administered 2018-02-16: 60 ug/min via INTRAVENOUS

## 2018-02-16 MED ORDER — NALBUPHINE HCL 10 MG/ML IJ SOLN: 5.0000 mg | INTRAMUSCULAR | Status: DC | PRN

## 2018-02-16 MED ORDER — FENTANYL CITRATE (PF) 100 MCG/2ML IJ SOLN
INTRAMUSCULAR | Status: DC | PRN
  Administered 2018-02-16: 20 ug via INTRATHECAL
  Administered 2018-02-16: 30 ug via INTRAVENOUS

## 2018-02-16 MED ORDER — COCONUT OIL OIL: 1.0000 "application " | TOPICAL_OIL | Status: DC | PRN

## 2018-02-16 MED ORDER — PRENATAL MULTIVITAMIN CH
1.0000 | ORAL_TABLET | Freq: Every day | ORAL | Status: DC
  Administered 2018-02-17 – 2018-02-18 (×2): 1 via ORAL
  Filled 2018-02-16 (×2): qty 1

## 2018-02-16 MED ORDER — NALBUPHINE HCL 10 MG/ML IJ SOLN: 5.0000 mg | Freq: Once | INTRAMUSCULAR | Status: DC | PRN

## 2018-02-16 MED ORDER — FENTANYL CITRATE (PF) 100 MCG/2ML IJ SOLN
INTRAMUSCULAR | Status: AC
  Filled 2018-02-16: qty 2

## 2018-02-16 MED ORDER — NALOXONE HCL 0.4 MG/ML IJ SOLN: 0.4000 mg | INTRAMUSCULAR | Status: DC | PRN

## 2018-02-16 MED ORDER — ONDANSETRON HCL 4 MG/2ML IJ SOLN
INTRAMUSCULAR | Status: AC
  Filled 2018-02-16: qty 2

## 2018-02-16 MED ORDER — SODIUM BICARBONATE 8.4 % IV SOLN
INTRAVENOUS | Status: DC | PRN
  Administered 2018-02-16: 6 mL via EPIDURAL
  Administered 2018-02-16: 4 mL via EPIDURAL
  Administered 2018-02-16: 5 mL via EPIDURAL

## 2018-02-16 MED ORDER — ACETAMINOPHEN 325 MG PO TABS
650.0000 mg | ORAL_TABLET | ORAL | Status: DC | PRN
  Administered 2018-02-18: 650 mg via ORAL
  Filled 2018-02-16: qty 2

## 2018-02-16 MED ORDER — MORPHINE SULFATE (PF) 0.5 MG/ML IJ SOLN
INTRAMUSCULAR | Status: DC | PRN
  Administered 2018-02-16: .2 mg via INTRATHECAL

## 2018-02-16 MED ORDER — DEXAMETHASONE SODIUM PHOSPHATE 4 MG/ML IJ SOLN
INTRAMUSCULAR | Status: AC
  Filled 2018-02-16: qty 1

## 2018-02-16 MED ORDER — ZOLPIDEM TARTRATE 5 MG PO TABS: 5.0000 mg | ORAL_TABLET | Freq: Every evening | ORAL | Status: DC | PRN

## 2018-02-16 MED ORDER — BUPIVACAINE IN DEXTROSE 0.75-8.25 % IT SOLN
INTRATHECAL | Status: DC | PRN
  Administered 2018-02-16: 1.2 mL via INTRATHECAL

## 2018-02-16 MED ORDER — TETANUS-DIPHTH-ACELL PERTUSSIS 5-2.5-18.5 LF-MCG/0.5 IM SUSP: 0.5000 mL | Freq: Once | INTRAMUSCULAR | Status: DC

## 2018-02-16 MED ORDER — OXYTOCIN 40 UNITS IN LACTATED RINGERS INFUSION - SIMPLE MED: 2.5000 [IU]/h | INTRAVENOUS | Status: AC

## 2018-02-16 MED ORDER — SIMETHICONE 80 MG PO CHEW
80.0000 mg | CHEWABLE_TABLET | ORAL | Status: DC | PRN
Start: 2018-02-16 — End: 2018-02-19

## 2018-02-16 MED ORDER — SCOPOLAMINE 1 MG/3DAYS TD PT72: 1.0000 | MEDICATED_PATCH | Freq: Once | TRANSDERMAL | Status: DC

## 2018-02-16 MED ORDER — SODIUM CHLORIDE 0.9% FLUSH: 3.0000 mL | INTRAVENOUS | Status: DC | PRN

## 2018-02-16 MED ORDER — LACTATED RINGERS IV SOLN
INTRAVENOUS | Status: DC | PRN
  Administered 2018-02-16 (×2): via INTRAVENOUS

## 2018-02-16 MED ORDER — SCOPOLAMINE 1 MG/3DAYS TD PT72
MEDICATED_PATCH | TRANSDERMAL | Status: DC | PRN
  Administered 2018-02-16: 1 via TRANSDERMAL

## 2018-02-16 SURGICAL SUPPLY — 38 items
BENZOIN TINCTURE PRP APPL 2/3 (GAUZE/BANDAGES/DRESSINGS) ×3 IMPLANT
CHLORAPREP W/TINT 26ML (MISCELLANEOUS) ×3 IMPLANT
CLAMP CORD UMBIL (MISCELLANEOUS) IMPLANT
CLOSURE WOUND 1/2 X4 (GAUZE/BANDAGES/DRESSINGS) ×2
CLOTH BEACON ORANGE TIMEOUT ST (SAFETY) ×3 IMPLANT
DRSG OPSITE POSTOP 4X10 (GAUZE/BANDAGES/DRESSINGS) ×3 IMPLANT
ELECT REM PT RETURN 9FT ADLT (ELECTROSURGICAL) ×3
ELECTRODE REM PT RTRN 9FT ADLT (ELECTROSURGICAL) ×1 IMPLANT
EXTRACTOR VACUUM KIWI (MISCELLANEOUS) IMPLANT
GLOVE BIOGEL PI IND STRL 7.0 (GLOVE) ×1 IMPLANT
GLOVE BIOGEL PI IND STRL 9 (GLOVE) ×1 IMPLANT
GLOVE BIOGEL PI INDICATOR 7.0 (GLOVE) ×2
GLOVE BIOGEL PI INDICATOR 9 (GLOVE) ×2
GLOVE SS PI 9.0 STRL (GLOVE) ×3 IMPLANT
GOWN STRL REUS W/TWL 2XL LVL3 (GOWN DISPOSABLE) ×3 IMPLANT
GOWN STRL REUS W/TWL LRG LVL3 (GOWN DISPOSABLE) ×3 IMPLANT
NEEDLE HYPO 25X5/8 SAFETYGLIDE (NEEDLE) IMPLANT
NS IRRIG 1000ML POUR BTL (IV SOLUTION) ×3 IMPLANT
PACK C SECTION WH (CUSTOM PROCEDURE TRAY) ×3 IMPLANT
PAD OB MATERNITY 4.3X12.25 (PERSONAL CARE ITEMS) ×3 IMPLANT
PENCIL SMOKE EVAC W/HOLSTER (ELECTROSURGICAL) ×3 IMPLANT
RETRACTOR TRAXI PANNICULUS (MISCELLANEOUS) ×1 IMPLANT
RTRCTR C-SECT PINK 25CM LRG (MISCELLANEOUS) IMPLANT
RTRCTR C-SECT PINK 34CM XLRG (MISCELLANEOUS) IMPLANT
STRIP CLOSURE SKIN 1/2X4 (GAUZE/BANDAGES/DRESSINGS) ×4 IMPLANT
SUT MNCRL 0 VIOLET CTX 36 (SUTURE) ×3 IMPLANT
SUT MONOCRYL 0 CTX 36 (SUTURE) ×6
SUT PLAIN 2 0 (SUTURE) ×4
SUT PLAIN ABS 2-0 CT1 27XMFL (SUTURE) ×2 IMPLANT
SUT VIC AB 0 CT1 27 (SUTURE) ×2
SUT VIC AB 0 CT1 27XBRD ANBCTR (SUTURE) ×1 IMPLANT
SUT VIC AB 2-0 CT1 27 (SUTURE) ×2
SUT VIC AB 2-0 CT1 TAPERPNT 27 (SUTURE) ×1 IMPLANT
SUT VIC AB 4-0 KS 27 (SUTURE) ×3 IMPLANT
SYR BULB IRRIGATION 50ML (SYRINGE) IMPLANT
TOWEL OR 17X24 6PK STRL BLUE (TOWEL DISPOSABLE) ×3 IMPLANT
TRAXI PANNICULUS RETRACTOR (MISCELLANEOUS) ×2
TRAY FOLEY W/BAG SLVR 14FR LF (SET/KITS/TRAYS/PACK) ×3 IMPLANT

## 2018-02-16 NOTE — Anesthesia Postprocedure Evaluation (Signed)
Anesthesia Post Note  Patient: Amber Warren  Procedure(s) Performed: CESAREAN SECTION (N/A Abdomen)     Patient location during evaluation: PACU Anesthesia Type: Spinal Level of consciousness: awake Pain management: pain level controlled Vital Signs Assessment: post-procedure vital signs reviewed and stable Respiratory status: spontaneous breathing Cardiovascular status: stable Postop Assessment: no headache, no backache, spinal receding, patient able to bend at knees and no apparent nausea or vomiting Anesthetic complications: no    Last Vitals:  Vitals:   02/16/18 2045 02/16/18 2100  BP: (!) 99/38 (!) 91/47  Pulse: 88 81  Resp: (!) 23 20  Temp:  37 C  SpO2: 97% 95%    Last Pain:  Vitals:   02/16/18 2100  TempSrc:   PainSc: 0-No pain   Pain Goal:                 Telesa Jeancharles JR,JOHN Tonnie Friedel

## 2018-02-16 NOTE — Progress Notes (Signed)
In-house interpreter, Jonette PesaViria Alverez, [facility].

## 2018-02-16 NOTE — Op Note (Signed)
Please see the brief operative note for surgical details 

## 2018-02-16 NOTE — Transfer of Care (Signed)
Immediate Anesthesia Transfer of Care Note  Patient: Amber Warren  Procedure(s) Performed: CESAREAN SECTION (N/A Abdomen)  Patient Location: PACU  Anesthesia Type:Spinal  Level of Consciousness: awake, alert , oriented and patient cooperative  Airway & Oxygen Therapy: Patient Spontanous Breathing  Post-op Assessment: Report given to RN and Post -op Vital signs reviewed and stable  Post vital signs: Reviewed and stable  Last Vitals:  Vitals Value Taken Time  BP    Temp    Pulse 93 02/16/2018  8:30 PM  Resp 18 02/16/2018  8:30 PM  SpO2 95 % 02/16/2018  8:30 PM  Vitals shown include unvalidated device data.  Last Pain:  Vitals:   02/16/18 1848  TempSrc: Oral  PainSc:          Complications: No apparent anesthesia complications

## 2018-02-16 NOTE — Progress Notes (Signed)
Amber KatzOfelia Warren is a 27 y.o. G2P1001 at 3075w1d by LMP admitted for induction of labor due to cholestasis of pregnancy prior cesarean at 5 cm for tolac.  Subjective:pt uncomfortable again despite 3rd topup of epidural   Objective: BP 120/65   Pulse 89   Temp 98.3 F (36.8 C) (Oral)   Resp 18   Ht 4\' 8"  (1.422 m)   Wt 191 lb (86.6 kg)   LMP 04/30/2017   SpO2 96%   BMI 42.82 kg/m  I/O last 3 completed shifts: In: -  Out: 1275 [Urine:1275] No intake/output data recorded. Having blood in the urine FHT:  FHR: 150 bpm, variability: moderate,  accelerations:  Present,  decelerations:  Present early's UC:   regular, every 3-4 minutes SVE:   Dilation: 7 Effacement (%): 80 Station: -3, -2 Exam by:: Dr. Emelda FearFerguson, Enis SlipperJane Bailey, RN  Labs: Lab Results  Component Value Date   WBC 10.3 02/15/2018   HGB 10.2 (L) 02/15/2018   HCT 31.6 (L) 02/15/2018   MCV 80.4 02/15/2018   PLT 245 02/15/2018    Assessment / Plan: Arrest in active phase of labor  Labor: arrest of labor confirmed Preeclampsia:   Fetal Wellbeing:  Category I Pain Control:  Epidural I/D:  n/a Anticipated MOD:  Cesarean section recommended, explained in detail and accepted by the pt. To or ASAP Tilda BurrowJohn V Bryant Lipps 02/16/2018, 6:39 PM

## 2018-02-16 NOTE — Progress Notes (Signed)
Vitals:   02/15/18 2330 02/16/18 0000  BP: (!) 121/94 118/66  Pulse: 68 74  Resp: 16 17  Temp:  97.8 F (36.6 C)  SpO2:     No change in cx since last exam. Ctx q 3-5 mintues, Pit at 11 mu/min.  FHR 130's, cat 1. WIll continue to increase pitocin

## 2018-02-16 NOTE — Progress Notes (Signed)
This morning, this RN witnessed the patient pushing the PCA button twice while relaxed, not breathing through contractions. This RN asked patient if she was in "mucho or poquito dilor".  Patient stated "poquito". This RN asked Eda, hospital interpreter, with Philipp DeputyKim Shaw, CNM, then with Cephus ShellingLinda Burger, CRNA, to explain to patient the proper use of the PCA button for pain.   This RN has also asked Dr. Cristela BlueKyle Jackson, who is fluent in Spanish, to explain the same to patient this morning, after patient again pushed PCA button as she was laughing & talking with husband.     This RN just witnessed patient pushing PCA button at end of contraction at 1604. Both Dr.Jackson and Cephus ShellingLinda Burger, CRNA ( with [facility], hospital interpreter),have explained the effects of excessive use of PCA button.

## 2018-02-16 NOTE — Op Note (Signed)
02/16/2018  9:15 PM  PATIENT:  Amber Warren  27 y.o. female  PRE-OPERATIVE DIAGNOSIS: Repeat low transverse cesarean section, failed TOLAC  POST-OPERATIVE DIAGNOSIS:  cesarean section same, delivered  PROCEDURE:  Procedure(s): CESAREAN SECTION (N/A) Repeat low transverse SURGEON:  Surgeon(s) and Role:    * Tilda BurrowFerguson, Veretta Sabourin V, MD - Primary  PHYSICIAN ASSISTANT:   ASSISTANTS: none   ANESTHESIA:   spinal epidural was discontinued  EBL:  1002 mL   BLOOD ADMINISTERED:none  DRAINS: Urinary Catheter (Foley)   LOCAL MEDICATIONS USED:  NONE  SPECIMEN:  No Specimen  DISPOSITION OF SPECIMEN:  To labor and delivery  COUNTS:  YES  TOURNIQUET:  * No tourniquets in log *  DICTATION: .Dragon Dictation  PLAN OF CARE: Patient has inpatient admission orders  PATIENT DISPOSITION:  PACU - hemodynamically stable.   Delay start of Pharmacological VTE agent (>24hrs) due to surgical blood loss or risk of bleeding: not applicable Indications, arrest of labor at 7 cm dilated 50% effaced -2 to -3 station, accompanied with hematuria.  Findings: Occiput posterior presentation Details of procedure: Patient was taken to the operating room prepped and draped for lower abdominal surgery.  Traxi abdominal wall traction device was placed.  Abdomen was prepped and draped.  The old prior Pfannenstiel incision was marked and an ellipse of the adjacent skin approximately 4 cm in width with underlying fatty tissue trimmed sharp dissection to the fascia followed by a transverse opening of the fascia and midline entry. Alexis wound retractor was positioned in place.  Bladder flap was developed.  Transverse uterine incision was made at the level of the fetal face being careful to enter the last portion of the uterine incision bluntly with index finger.  Incision was extended cephalad and caudad in the fetal vertex rotated from occiput posterior position into the incision and delivered with fundal pressure  without much difficulty.  The lower uterine segment was quite floppy and elongate from the long labor.  The cord was clamped at 1 minute the baby passed waiting pediatricians.  See their notes for details.  The placenta responded to Pitocin and crede uterine massage and delivered intact with membranes removed intact the uterine incision extended inferiorly on the patient's right side but did not extend all the way down to the cervix. Uterus was closed in a running locking first layer and a continuous running second layer the bladder flap was loosely reapproximated with interrupted 2-0 Vicryl. Abdomen was irrigated out.  This retractor and all surgical instruments removed.  Anterior peritoneum closed using running 2-0 Vicryl.  Fascia was closed using continuous running 0 Vicryl and subcutaneous tissues reapproximated using interrupted 2 oh plain. Subcuticular 4-0 Vicryl on a Keith needle completed the skin closure.

## 2018-02-16 NOTE — Progress Notes (Signed)
S: Patient seen & examined for progress of labor. Pain control continues to be a challenge.   O:  Vitals:   02/16/18 0210 02/16/18 0230 02/16/18 0300 02/16/18 0330  BP: (!) 107/46 (!) 98/49 (!) 101/47 113/64  Pulse: 84 87 88 79  Resp:  18 16 17   Temp:      TempSrc:      SpO2:      Weight:      Height:        Dilation: 5 Effacement (%): 50 Station: Ballotable Presentation: Vertex Exam by:: Gorden HarmsMegan Janyia Guion, Resident    FHT: 130 bpm, mod var, +accels, no decels TOCO: q2-264min   A/P: This is a 27 y.o. female G2P1001 with IUP at 9325w0d by LMP presenting for IOL for cholestasis- TOLAC, previous c/s for failure to progress.   Pain control continues to be a challenge Epidural in place. Has been rebolused and patient with PRN additional boluses Ultrasound confirmed vertex positioning Pitocin continues to be uptitrated as tolerated Continue expectant management, otherwise Anticipate SVD   Gorden HarmsMegan Stacee Earp, MD PGY-3 02/16/2018 3:37 AM

## 2018-02-16 NOTE — Progress Notes (Addendum)
Visit performed with hospital spanish interpreter  Subjective: Doing well with no issues. Pain very well controlled with redosed epidural.   Objective: BP (!) 130/57   Pulse 91   Temp 97.7 F (36.5 C) (Oral)   Resp 20   Ht 4\' 8"  (1.422 m)   Wt 86.6 kg (191 lb)   LMP 04/30/2017   SpO2 94%   BMI 42.82 kg/m  I/O last 3 completed shifts: In: -  Out: 1275 [Urine:1275] No intake/output data recorded.  FHT:  FHR: 130 bpm, variability: moderate,  accelerations:  Present,  decelerations:  Absent UC:   regular, every 2-4 minutes SVE:   Dilation: 8 Effacement (%): 80 Station: -3 Exam by:: Amber SlipperJane Bailey, RN  Labs: Lab Results  Component Value Date   WBC 10.3 02/15/2018   HGB 10.2 (L) 02/15/2018   HCT 31.6 (L) 02/15/2018   MCV 80.4 02/15/2018   PLT 245 02/15/2018    Assessment / Plan: Induction of labor due to cholestasis,  progressing well on pitocin  Labor: S/P SROM, IUPC. On pitocin. S/P FB Preeclampsia:  N/A Fetal Wellbeing:  Category I Pain Control:  Epidural I/D:  n/a Anticipated MOD:  NSVD  Amber Warren 02/16/2018, 4:04 PM

## 2018-02-16 NOTE — Anesthesia Procedure Notes (Signed)
Spinal  Patient location during procedure: OR Start time: 02/16/2018 7:08 PM End time: 02/16/2018 7:13 PM Staffing Anesthesiologist: Leilani AbleHatchett, Zarria Towell, MD Performed: anesthesiologist  Preanesthetic Checklist Completed: patient identified, site marked, surgical consent, pre-op evaluation, timeout performed, IV checked, risks and benefits discussed and monitors and equipment checked Spinal Block Patient position: sitting Prep: site prepped and draped and DuraPrep Patient monitoring: continuous pulse ox and blood pressure Approach: midline Location: L2-3 Injection technique: single-shot Needle Needle type: Pencan and Sprotte  Needle length: 15 cm Needle insertion depth: 6 cm Assessment Sensory level: T4

## 2018-02-16 NOTE — Progress Notes (Signed)
Almon RegisterVera Alverez interpretor at bedside during c/s and spinal

## 2018-02-16 NOTE — Progress Notes (Signed)
Patient ID: Nadeen Landaufelia Alarcon-Bautista, female   DOB: 10/02/1991, 27 y.o.   MRN: [id number]  ** Visit conduced with Eda  Comfortable at times w/ epidural; having intermittent leg pain  BP 119/81, other VSS FHR 120s, +accels, no decels Ctx q 3 mins w/ Pit @ 9328mu/min Cx 5/50/-2; membranes ruptured spont during exam for clear fluid; IUPC placed and MVUs initially approx 130  IUP@term  TOLAC IOL process Cholestasis  Titrate Pit to adequate labor Plan for cx exams q 2-3 hrs once adequate  Cam HaiSHAW, Gunda Maqueda CNM 02/16/2018

## 2018-02-17 ENCOUNTER — Other Ambulatory Visit: Payer: Self-pay

## 2018-02-17 LAB — CBC
HEMATOCRIT: 27.2 % — AB (ref 36.0–46.0)
Hemoglobin: 8.8 g/dL — ABNORMAL LOW (ref 12.0–15.0)
MCH: 26 pg (ref 26.0–34.0)
MCHC: 32.4 g/dL (ref 30.0–36.0)
MCV: 80.2 fL (ref 78.0–100.0)
PLATELETS: 229 10*3/uL (ref 150–400)
RBC: 3.39 MIL/uL — ABNORMAL LOW (ref 3.87–5.11)
RDW: 15.3 % (ref 11.5–15.5)
WBC: 16.9 10*3/uL — AB (ref 4.0–10.5)

## 2018-02-17 LAB — CREATININE, SERUM
Creatinine, Ser: 0.54 mg/dL (ref 0.44–1.00)
GFR calc non Af Amer: >60 mL/min (ref >60)

## 2018-02-17 NOTE — Progress Notes (Signed)
[employer] Spanish interpreter used for translation related to care, voiding, infant feeding and pain assessment. Interpreter [phone]

## 2018-02-17 NOTE — Addendum Note (Signed)
Addendum  created 02/17/18 0757 by Armanda Heritage, CRNA   Sign clinical note

## 2018-02-17 NOTE — Progress Notes (Signed)
Subjective: Postpartum Day 1: Cesarean Delivery Patient reports tolerating PO, + flatus and no problems voiding.    Objective: Vital signs in last 24 hours: Temp:  [98.2 F (36.8 C)-99.1 F (37.3 C)] 98.2 F (36.8 C) (04/27 1806) Pulse Rate:  [68-90] 75 (04/27 1806) Resp:  [16-30] 18 (04/27 1806) BP: (90-109)/(43-76) 90/50 (04/27 1806) SpO2:  [92 %-98 %] 98 % (04/27 1806)  Physical Exam:  General: alert, cooperative, appears stated age and no distress Lochia: appropriate Uterine Fundus: firm Incision: no significant drainage, no dehiscence, no significant erythema DVT Evaluation: No evidence of DVT seen on physical exam.  Recent Labs    02/15/18 0749 02/17/18 0617  HGB 10.2* 8.8*  HCT 31.6* 27.2*    Assessment/Plan: Status post Cesarean section. Doing well postoperatively.  Continue current care.  Spanish interpreter used  [place] 02/17/2018, 9:18 PM

## 2018-02-17 NOTE — Lactation Note (Signed)
This note was copied from a baby's chart. Lactation Consultation Note  Patient Name: Amber Warren ZOXWR'U Date: 02/17/2018 Reason for consult: Initial assessment;Early term 77-38.6wks Spanish interpreter present for visit.  Baby is 48 hours old and has been to breast 3 times.  Instructed to watch for feeding cues and call for assist prn.  If baby is not waking for feeds mom and baby should go skin to skin.  Baby is currently sleeping in FOB'S arms.  Breastfeeding consultation services and support information given in Spanish.  Maternal Data Has patient been taught Hand Expression?: Yes Does the patient have breastfeeding experience prior to this delivery?: Yes  Feeding Feeding Type: Breast Fed  LATCH Score Latch: Repeated attempts needed to sustain latch, nipple held in mouth throughout feeding, stimulation needed to elicit sucking reflex.  Audible Swallowing: A few with stimulation  Type of Nipple: Everted at rest and after stimulation  Comfort (Breast/Nipple): Soft / non-tender  Hold (Positioning): Assistance needed to correctly position infant at breast and maintain latch.  LATCH Score: 7  Interventions Interventions: Assisted with latch;Skin to skin;Support pillows;Expressed milk;Adjust position  Lactation Tools Discussed/Used     Consult Status Consult Status: Follow-up Date: 02/18/18 Follow-up type: In-patient    Huston Foley 02/17/2018, 2:16 PM

## 2018-02-17 NOTE — Anesthesia Postprocedure Evaluation (Signed)
Anesthesia Post Note  Patient: Brandye Alarcon-Bautista  Procedure(s) Performed: CESAREAN SECTION (N/A Abdomen)     Patient location during evaluation: Mother Baby Anesthesia Type: Epidural Level of consciousness: awake and alert Pain management: pain level controlled Vital Signs Assessment: post-procedure vital signs reviewed and stable Respiratory status: spontaneous breathing Cardiovascular status: blood pressure returned to baseline Postop Assessment: no headache, patient able to bend at knees, no backache, no apparent nausea or vomiting, epidural receding and adequate PO intake Anesthetic complications: no    Last Vitals:  Vitals:   02/16/18 2300 02/17/18 0100  BP: (!) 101/55 (!) 104/55  Pulse: 79 81  Resp: (!) 22 20  Temp: 37.3 C 37.1 C  SpO2: 96% 95%    Last Pain:  Vitals:   02/17/18 0530  TempSrc:   PainSc: 0-No pain   Pain Goal:                 Salome Arnt

## 2018-02-18 ENCOUNTER — Encounter (HOSPITAL_COMMUNITY): Payer: Self-pay | Admitting: Obstetrics and Gynecology

## 2018-02-18 DIAGNOSIS — O9962 Diseases of the digestive system complicating childbirth: Secondary | ICD-10-CM

## 2018-02-18 DIAGNOSIS — Z3A37 37 weeks gestation of pregnancy: Secondary | ICD-10-CM

## 2018-02-18 DIAGNOSIS — O34211 Maternal care for low transverse scar from previous cesarean delivery: Secondary | ICD-10-CM

## 2018-02-18 MED ORDER — OXYCODONE HCL 5 MG PO TABS
10.0000 mg | ORAL_TABLET | ORAL | Status: DC | PRN
  Filled 2018-02-18: qty 2

## 2018-02-18 MED ORDER — OXYCODONE HCL 5 MG PO TABS: 5.0000 mg | ORAL_TABLET | ORAL | 0 refills | Status: DC | PRN

## 2018-02-18 MED ORDER — MAGNESIUM HYDROXIDE 400 MG/5ML PO SUSP
30.0000 mL | Freq: Once | ORAL | Status: AC
  Administered 2018-02-18: 30 mL via ORAL
  Filled 2018-02-18: qty 30

## 2018-02-18 MED ORDER — OXYCODONE HCL 5 MG PO TABS
5.0000 mg | ORAL_TABLET | ORAL | Status: DC | PRN
  Administered 2018-02-18 (×2): 5 mg via ORAL
  Filled 2018-02-18 (×2): qty 1

## 2018-02-18 NOTE — Discharge Instructions (Signed)
Parto por Amber Warren, cuidados posteriores Cesarean Delivery, Care After Siga estas instrucciones durante las prximas semanas. Estas indicaciones le proporcionan informacin acerca de cmo deber cuidarse despus del procedimiento. Su mdico tambin podr darle indicaciones ms especficas. El tratamiento ha sido planificado segn las prcticas mdicas actuales, pero en algunos casos pueden ocurrir problemas. Comunquese con el mdico si tiene algn problema o preguntas despus del procedimiento. Qu puedo esperar despus del procedimiento? Despus del procedimiento, es comn DIRECTVtener los siguientes sntomas:  Una pequea cantidad de sangre o un lquido transparente que sale de la incisin.  Tiene enrojecimiento, hinchazn o dolor en la zona de la incisin.  Dolores y [employer] molestias abdominales.  Hemorragia vaginal (loquios).  Calambres plvicos.  Fatiga.  Siga estas indicaciones en su casa: Cuidados de la incisin   Siga las indicaciones del mdico acerca del cuidado de la incisin. Haga lo siguiente: ? Lvese las manos con agua y jabn antes de cambiar la venda (vendaje). Use desinfectante para manos si no dispone de [place] y [place]n. ? Cambie el vendaje como se lo haya indicado el mdico. ? No retire los puntos (suturas), las grapas cutneas, la goma para cerrar la piel o las tiras Sligoadhesivas. Es posible que estos cierres cutneos Conservation officer, naturedeban permanecer en la piel durante 2semanas o ms tiempo. Si los bordes de las tiras [address]adhesivas empiezan a despegarse y Scientific laboratory technicianenroscarse, puede recortar los que estn sueltos. No retire las tiras Agilent Technologiesadhesivas por completo a menos que el mdico se lo indique.  Controle todos los das la zona de la incisin para detectar signos de infeccin. Est atenta a los siguientes signos: ? Aumento del enrojecimiento, la hinchazn o Chief Technology Officerel dolor. ? Mayor presencia de lquido o Afftonsangre. ? Calor. ? Pus o mal olor.  Cuando tosa o estornude, abrace Rockwell Automationuna almohada. Esto ayuda con el dolor y Apple Computerdisminuye  la posibilidad de que su incisin se abra (dehiscencia). Haga esto hasta que cicatrice completamente. Medicamentos  CenterPoint Energyome los medicamentos de venta libre y los recetados solamente como se lo haya indicado el mdico.  Si le recetaron un antibitico, tmelo como se lo haya indicado el mdico. No interrumpa la administracin del antibitico hasta que lo haya terminado. Conducir  No conduzca ni opere maquinaria pesada mientras toma analgsicos recetados.  No conduzca durante 24horas si le administraron un sedante. Estilo de vida  No beba alcohol. Esto es de suma importancia si est amamantando o toma analgsicos.  No consuma productos que contengan tabaco, incluidos cigarrillos, tabaco de Theatre managermascar o cigarrillos electrnicos. Si necesita ayuda para dejar de fumar, consulte al mdico. El tabaco puede retrasar la cicatrizacin. Qu debe comer y beber  Beba al menos 8vasos de ochoonzas (240cc) de agua todos los [address]  [address] a menos que el mdico le indique lo contrario. Si amamanta, quiz deba beber an ms cantidad de agua.  Coma alimentos ricos en Enbridge Energyfibras todos los das. Estos alimentos pueden ayudarla a prevenir o Educational psychologistaliviar el estreimiento. Los alimentos ricos en fibras incluyen, entre otros: ? Panes y cereales integrales. ? Arroz integral. ? Armed forces operational officerrijoles. ? Nils PyleFrutas y verduras frescas. Actividad  Retome sus actividades normales como se lo haya indicado el mdico. Pregntele al mdico qu actividades son seguras para usted.  Descanse todo lo que pueda. Trate de descansar o tomar una siesta mientras el beb duerme.  No levante objetos que pesen ms que su beb o ms de 10 libras (4,5 kg), como se lo haya indicado el mdico.  Pregntele al mdico cundo puede retomar la actividad sexual. Esto puede depender de  lo siguiente: ? Riesgo de sufrir una infeccin. ? Velocidad de cicatrizacin. ? Comodidad y deseo de [employer] sexual. Baarse  No tome baos de inmersin, no nade ni use el jacuzzi  hasta que el mdico lo autorice. Pregntele al mdico si puede ducharse. Delle Reining solo le permitan darse baos de esponja hasta que la incisin se cure.  Mantenga el vendaje seco, como se lo haya indicado el mdico. Instrucciones generales  No use tampones ni se haga duchas vaginales hasta que el mdico la autorice.  Use lo siguiente: ? Ropa cmoda y suelta. ? Un sostn firme y [place].  Controle la sangre que elimina por la vagina para detectar cogulos de [place]. Estos pueden tener el aspecto de grumos de color rojo oscuro, o secrecin marrn o negra.  Mantenga el perineo limpio y seco, como se lo haya indicado el mdico.  Cuando vaya al bao, siempre higiencese de adelante hacia atrs.  Si es posible, pdale a alguien que la ayude a cuidar de su beb y con las tareas del hogar durante [employer] despus de que le den el alta del hospital.  Chauncy Passy a todas las visitas de seguimiento para usted y el beb, como se lo haya indicado el mdico. Esto es importante. Comunquese con un mdico si:  Tiene los siguientes sntomas: ? Una secrecin vaginal con mal olor. ? Dificultad para orinar. ? Dolor al ConocoPhillips. ? Aumento o disminucin repentinos de la frecuencia de las deposiciones. ? Aumento del enrojecimiento, la hinchazn o el dolor alrededor de la incisin. ? Aumento del lquido o de la sangre que sale de la incisin. ? Pus o mal olor en Immunologist de la incisin. ? Fiebre. ? Erupcin cutnea. ? Poco inters o falta de inters en actividades que solan gustarle. ? Dudas sobre su cuidado y el del beb. ? Nuseas.  La incisin est caliente al tacto.  Siente dolor en las mamas y se ponen rojas o duras.  Siente tristeza o preocupacin de forma inusual.  Vomita.  Elimina cogulos de sangre grandes por la vagina. Si expulsa un cogulo de sangre, gurdelo para mostrrselo al [employer]. No tire la cadena sin mostrarle los cogulos de sangre a su mdico.  Orina ms de lo  habitual.  Se siente mareada o se desmaya.  No ha amamantado y no ha tenido un perodo menstrual durante 12 semanas despus del [place].  Dej de amamantar al beb y no ha tenido su perodo menstrual durante 12 semanas despus de haber dejado de Museum/gallery exhibitions officer. Solicite ayuda de inmediato si:  Tiene los siguientes sntomas: ? Dolor que no desaparece o no mejora con medicamentos. ? Journalist, newspaper. ? Dificultad para respirar. ? Visin borrosa o Nurse, adult. ? Pensamientos de autolesionarse o lesionar al beb. ? Air cabin crew abdomen o en una de las piernas. ? Dolor de cabeza intenso.  Se desmaya.  Tiene una hemorragia tan intensa de la vagina que [employer] compresas higinicas en Georgianne Fick. Esta informacin no tiene Theme park manager el consejo del mdico. Asegrese de hacerle al mdico cualquier pregunta que tenga. Document Released: 10/10/2005 Document Revised: 01/30/2017 Document Reviewed: 09/14/2015 Elsevier Interactive Patient Education  [employer].

## 2018-02-18 NOTE — Progress Notes (Signed)
[employer] Spanish Interpreter, Jesus [phone], used to communicate with patient to assess her pain and discuss medications and answer questions.   Patient rates her pain at 7 and wants "stronger pain medication" as given. She states her incision and abdomen hurts with gas. Milk of Magnesium po given along with other medications, including one Oxy IR po.   Discussed ambulating in hallway to assist with passing flatus once pain medication is effective. Patient verbalizes understanding.  Discussed infant feeding, states she only wants to formula feed her baby today because she is hurting. Will breastfeed when she feels better.   Patient had questions related to discharge for her and her baby. Discussed her care if baby is made the patient because he can not go home today. She states she has access to getting pain medication RX filled by husband, if needed. No additional questions.

## 2018-02-18 NOTE — Discharge Summary (Signed)
OB Discharge Summary     Patient Name: Amber Warren DOB: July 21, 1991 MRN: [id number]  Date of admission: 02/15/2018 Delivering MD: Tilda Burrow   Date of discharge: 02/18/2018  Admitting diagnosis: INDUCTION Intrauterine pregnancy: [redacted]w[redacted]d     Secondary diagnosis:  Active Problems:   Cholestasis during pregnancy in third trimester  Additional problems: prior cesarean desiring TOLAC     Discharge diagnosis: Term Pregnancy Delivered and CPD , failed TOLAC                                                                                                Post partum procedures:  Augmentation: Pitocin  Complications: None  Hospital course:  Induction of Labor With Cesarean Section  27 y.o. yo G2P2002 at [redacted]w[redacted]d was admitted to the hospital 02/15/2018 for induction of labor at 37 weeks due to cholestasis of pregnancy.. Patient had a labor course significant for a 2-day induction beginning on 7 AM on 02/15/2018 she had a Foley been placed to help with cervical ripening, and then was placed on Pitocin. The patient went for cesarean section due to Arrest of Dilation and Arrest of Descent, and delivered a Viable infant,02/16/2018 Membrane Rupture Time/Date: 10:05 AM ,02/16/2018   Details of operation can be found in separate operative Note.  Patient had an uncomplicated postpartum course. She is ambulating, tolerating a regular diet, passing flatus, and urinating well.  Patient is discharged home in stable condition on 02/18/18.                                    Physical exam  Vitals:   02/17/18 0930 02/17/18 1341 02/17/18 1806 02/18/18 0602  BP: (!) 101/49 (!) 91/43 (!) 90/50 (!) 118/58  Pulse: 84 84 75 76  Resp: Temp: 98.2 F (36.8 C) 98.2 F (36.8 C) 98.2 F (36.8 C) 98 F (36.7 C)  TempSrc: Oral Oral Oral Oral  SpO2: 94% 97% 98% 100%  Weight:      Height:       General: alert, cooperative and no distress Lochia: appropriate Uterine Fundus: firm Incision:  Healing well with no significant drainage DVT Evaluation: No evidence of DVT seen on physical exam. Labs: Lab Results  Component Value Date   WBC 16.9 (H) 02/17/2018   HGB 8.8 (L) 02/17/2018   HCT 27.2 (L) 02/17/2018   MCV 80.2 02/17/2018   PLT 229 02/17/2018   CMP Latest Ref Rng & Units 02/17/2018  Glucose 65 - 99 mg/dL -  BUN 6 - 20 mg/dL -  Creatinine 4.09 - 8.11 mg/dL 9.14  Sodium 782 - 956 mmol/L -  Potassium 3.5 - 5.1 mmol/L -  Chloride 101 - 111 mmol/L -  CO2 22 - 32 mmol/L -  Calcium 8.9 - 10.3 mg/dL -  Total Protein 6.5 - 8.1 g/dL -  Total Bilirubin 0.3 - 1.2 mg/dL -  Alkaline Phos 38 - 213 U/L -  AST 15 - 41 U/L -  ALT 14 - 54 U/L -  Discharge instruction: per After Visit Summary and "Baby and Me Booklet".  After visit meds: Prenatal vitamins, oxycodone 5 mg x 12 tablet   Diet: routine diet  Activity: Advance as tolerated. Pelvic rest for 6 weeks.   [facility] 6 weeks Follow up Appt:No future appointments. Follow up Visit:No follow-ups on file.  Postpartum contraception: Abstinence, til follow-up at health department Newborn Data: Live born female  Birth Weight: 7 lb 14.5 oz (3585 g) APGAR: 9, 9  Newborn Delivery   Birth date/time:  02/16/2018 19:36:00 Delivery type:  C-Section, Low Transverse Trial of labor:  Yes C-section categorization:  Repeat     Baby Feeding: Breast Disposition:home with mother   02/18/2018 Tilda Burrow, MD

## 2018-02-18 NOTE — Progress Notes (Signed)
CSW received consult for patient regarding her depression diagnosis and recent PHQ9 & Edinburgh scores. CSW met with patient and interpreter for discussion of needs. This is the second birth for the patient, she has a 36 month old little boy at home with his father. Patient lives with her husband at child. Patient's newborn's name is Amber Warren. Patient has a new car seat for infant and understands how to use and install it. Patient says infant will sleep in a crib and identified SIDS reduction techniques. Patient states she has no mental health counselor and that her mood has been good since delivery. CSW educated patient on baby blues period versus postpartum depression and how to access services. Patient states that she does not remember the name of the pediatric practice that she will use but it is located in [place]. Patient does not receive WIC, CSW educated patient on resource and how to access if nutritional needs arise. Patient is currently bottle feeding newborn. Patient states she has all basic necessities for infant at home. Patient is unemployed. Patient did not have further questions for CSW, CSW encouraged patient to reach out if needs or concerns arise.  Carol Watson, MSW, LCSW-A Clinical Social Worker [facility] [phone]  

## 2018-02-18 NOTE — Lactation Note (Signed)
This note was copied from a baby's chart. Lactation Consultation Note  Patient Name: Amber Warren OZHYQ'M Date: 02/18/2018 Reason for consult: Follow-up assessment;Early term 37-38.6wks;Difficult latch;Maternal endocrine disorder Type of Endocrine Disorder?: PCOS  28 hours old early term baby having difficulty latching on; mom was still exclusively BF until tonight at midnight when [facility] supplemented with Similac formula. RN called for latch assistance; mom had baby STS at the breast trying to latch him on, but baby was very fussy and crying. RN came to do a serum bilirubin and baby got even more fussy after his test, he did not latch at all, he kept crying. Tried again after he calmed down, LC did some hand expression with mom prior latching but only was able to express 1 droplet of colostrum out of each breast.   LC did some suck training to try to get baby to suck but his suck was uncoordinated, he'd bite on LC's finger. Tried to latch baby on this time on football position to the right breast but baby kept cueing and when briefly latched on mom's breast he'd also bite, with no sucking reflex elicit. Mom was concerned about baby not getting enough, asked mom how does she feel about pumping and supplementing with her own milk and also completing the volumes required according to baby's age with some formula, since she already has Hx of low milk supply due to PCOS, very little to no colostrum was seen when hand expressing.  Mom verbalized she'd like to pump and also to supplement with formula, the formula of choice was Similac Advanced and the method a curved tip syringe. Showed mom how to finger feed baby with a curved tip syringe, had to pause multiple times due to baby unsteady sucking/swallowing pattern. Baby took 8 ml of formula during this feeding, baby left with mom to be burped. Formula guidelines and volumes were discussed. Left 2 nipples just in case mom has a hard time syringe  feeding baby.   Set mom up with a DEBP, reviewed pump instructions, cleaning and storage. Mom will pump every 3 hours and at least once at night. Encouraged to keep taking baby to the breast STS 8-12 times/24 hours or sooner if feeding cues are present. She'll wake baby up to feed if no cueing within 3 hours. Mom will also supplement baby with her own express milk (if any) and complete the volumes with Similac formula according to formula chart (SP). She's aware of LC services and will call PRN.  Maternal Data    Feeding Feeding Type: Formula    Interventions Interventions: Breast feeding basics reviewed;Assisted with latch;Skin to skin;Breast massage;Hand express;Breast compression;Adjust position;Support pillows;Position options;Expressed milk;DEBP  Lactation Tools Discussed/Used Tools: Other (comment)(curved tip syringe) Pump Review: Setup, frequency, and cleaning;Milk Storage Initiated by:: MPeck Date initiated:: 02/18/18   Consult Status Consult Status: Follow-up Date: 02/19/18 Follow-up type: In-patient    Amber Warren 02/18/2018, 12:17 AM

## 2018-02-19 ENCOUNTER — Encounter: Payer: Self-pay | Admitting: Obstetrics & Gynecology

## 2018-02-19 ENCOUNTER — Encounter (HOSPITAL_COMMUNITY): Payer: Self-pay

## 2018-02-19 ENCOUNTER — Other Ambulatory Visit: Payer: Self-pay

## 2018-02-19 MED ORDER — OXYCODONE HCL 5 MG PO TABS: 5.0000 mg | ORAL_TABLET | Freq: Four times a day (QID) | ORAL | 0 refills | Status: DC | PRN

## 2018-02-19 MED ORDER — IBUPROFEN 600 MG PO TABS: 600.0000 mg | ORAL_TABLET | Freq: Four times a day (QID) | ORAL | 0 refills | Status: DC

## 2018-02-19 NOTE — Progress Notes (Signed)
Discharge instructions completed with [facility] spanish interpreter # [phone].

## 2018-02-19 NOTE — Discharge Summary (Addendum)
OB Discharge Summary     Patient Name: Amber Warren DOB: [birth year] MRN: [id number]  Date of admission: 02/15/2018 Delivering MD: Tilda Burrow   Date of discharge: 02/19/2018  Admitting diagnosis: INDUCTION Intrauterine pregnancy: [redacted]w[redacted]d     Secondary diagnosis:  Active Problems:   Cholestasis during pregnancy in third trimester  Additional problems:  PCOS Depression History of previous C-section     Discharge diagnosis: Term Pregnancy Delivered                                                                                                Post partum procedures:none  Augmentation: Pitocin and Foley Balloon  Complications: None  Hospital course:  Induction of Labor With Cesarean Section  27 y.o. yo G2P2002 at [redacted]w[redacted]d was admitted to the hospital 02/15/2018 for induction of labor. Patient had a labor course significant for 2-day induction of labor beginning at 7 am on 02/15/18. The patient went for cesarean section due to Arrest of Dilation, and delivered a Viable infant,02/16/2018  Membrane Rupture Time/Date: 10:05 AM ,02/16/2018  Details of operation can be found in separate operative Note.  Patient had an uncomplicated postpartum course. She is ambulating, tolerating a regular diet, passing flatus, and urinating well.  Patient is discharged home in stable condition on 02/19/18.                                    Physical exam  Vitals:   02/18/18 0602 02/18/18 1746 02/18/18 2035 02/19/18 0511  BP: (!) 118/58 (!) 111/54 97/62 103/62  Pulse: 76 67 64 64  Resp: 18 (!) Temp: 98 F (36.7 C) 98.3 F (36.8 C) 98.1 F (36.7 C) 98.2 F (36.8 C)  TempSrc: Oral Oral Oral Oral  SpO2: 100% 99% 100% 99%  Weight:      Height:       General: alert, cooperative and no distress Lochia: appropriate Uterine Fundus: firm Incision: Healing well with no significant drainage DVT Evaluation: No evidence of DVT seen on physical exam. Labs: Lab Results  Component Value  Date   WBC 16.9 (H) 02/17/2018   HGB 8.8 (L) 02/17/2018   HCT 27.2 (L) 02/17/2018   MCV 80.2 02/17/2018   PLT 229 02/17/2018   CMP Latest Ref Rng & Units 02/17/2018  Glucose 65 - 99 mg/dL -  BUN 6 - 20 mg/dL -  Creatinine 7.82 - 9.56 mg/dL 2.13  Sodium 086 - 578 mmol/L -  Potassium 3.5 - 5.1 mmol/L -  Chloride 101 - 111 mmol/L -  CO2 22 - 32 mmol/L -  Calcium 8.9 - 10.3 mg/dL -  Total Protein 6.5 - 8.1 g/dL -  Total Bilirubin 0.3 - 1.2 mg/dL -  Alkaline Phos 38 - 469 U/L -  AST 15 - 41 U/L -  ALT 14 - 54 U/L -    Discharge instruction: per After Visit Summary and "Baby and Me Booklet".  After visit meds:  Allergies as of 02/19/2018   No Known Allergies  Medication List    STOP taking these medications   diphenhydrAMINE 25 MG tablet Commonly known as:  BENADRYL   ursodiol 300 MG capsule Commonly known as:  ACTIGALL     TAKE these medications   famotidine 20 MG tablet Commonly known as:  PEPCID Take 1 tablet (20 mg total) by mouth 2 (two) times daily.   ibuprofen 600 MG tablet Commonly known as:  ADVIL,MOTRIN Take 1 tablet (600 mg total) by mouth every 6 (six) hours.   multivitamin-prenatal 27-0.8 MG Tabs tablet Take 1 tablet by mouth daily at 12 noon.   oxyCODONE 5 MG immediate release tablet Commonly known as:  Oxy IR/ROXICODONE Take 1 tablet (5 mg total) by mouth every 4 (four) hours as needed (pain scale 4-7).   vitamin B-6 25 MG tablet Commonly known as:  pyridOXINE Take 1 tablet (25 mg total) daily by mouth.       Diet: routine diet  Activity: Advance as tolerated. Pelvic rest for 6 weeks.   Outpatient follow up:Follow-up 1 week for incision check and 4 weeks for routine postpartum care  Postpartum contraception: IUD Mirena  Newborn Data: Live born female  Birth Weight: 7 lb 14.5 oz (3585 g) APGAR: 9, 9  Newborn Delivery   Birth date/time:  02/16/2018 19:36:00 Delivery type:  C-Section, Low Transverse Trial of labor:  Yes C-section  categorization:  Repeat     Baby Feeding: Bottle and Breast Disposition:home with mother   02/19/2018 Gorden Harms, MD  OB FELLOW DISCHARGE ATTESTATION I have seen and examined this patient and agree with above documentation in the resident's note.   Patient doing well. VS wnl. Incision clean, dry, intact.   Frederik Pear, MD OB Fellow 12:46 PM

## 2018-02-19 NOTE — Progress Notes (Signed)
Patient did not want me to order lunch. Eda H Royal Interpreter.

## 2018-02-19 NOTE — Lactation Note (Signed)
This note was copied from a baby's chart. Lactation Consultation Note  Patient Name: Amber Warren ZOXWR'U Date: 02/19/2018  Spanish interpreter present.  Mom states baby started latching during the night.  She has a pump at home.  No questions or concerns.  Lactation services reviewed and encouraged.   Maternal Data    Feeding Feeding Type: Bottle Fed - Formula Nipple Type: Slow - flow  LATCH Score                   Interventions    Lactation Tools Discussed/Used     Consult Status      Huston Foley 02/19/2018, 9:32 AM

## 2018-02-26 ENCOUNTER — Other Ambulatory Visit: Payer: Self-pay

## 2018-02-26 ENCOUNTER — Encounter: Payer: Self-pay | Admitting: Obstetrics & Gynecology

## 2018-02-28 ENCOUNTER — Encounter: Payer: Self-pay | Admitting: *Deleted

## 2018-03-24 ENCOUNTER — Encounter (HOSPITAL_COMMUNITY): Payer: Self-pay | Admitting: Emergency Medicine

## 2018-03-24 ENCOUNTER — Emergency Department (HOSPITAL_COMMUNITY)
Admission: EM | Admit: 2018-03-24 | Discharge: 2018-03-24 | Disposition: A | Discharge status: Home / Self Care | Payer: Self-pay | Attending: Emergency Medicine | Admitting: Emergency Medicine

## 2018-03-24 ENCOUNTER — Emergency Department (HOSPITAL_COMMUNITY): Payer: Self-pay

## 2018-03-24 ENCOUNTER — Other Ambulatory Visit: Payer: Self-pay

## 2018-03-24 DIAGNOSIS — N12 Tubulo-interstitial nephritis, not specified as acute or chronic: Secondary | ICD-10-CM | POA: Insufficient documentation

## 2018-03-24 DIAGNOSIS — Z79899 Other long term (current) drug therapy: Secondary | ICD-10-CM | POA: Insufficient documentation

## 2018-03-24 LAB — COMPREHENSIVE METABOLIC PANEL
ALT: 47 U/L (ref 14–54)
ANION GAP: 8 (ref 5–15)
AST: 31 U/L (ref 15–41)
Albumin: 3.8 g/dL (ref 3.5–5.0)
Alkaline Phosphatase: 89 U/L (ref 38–126)
BILIRUBIN TOTAL: 0.4 mg/dL (ref 0.3–1.2)
BUN: 13 mg/dL (ref 6–20)
CHLORIDE: 107 mmol/L (ref 101–111)
CO2: 22 mmol/L (ref 22–32)
Calcium: 8.8 mg/dL — ABNORMAL LOW (ref 8.9–10.3)
Creatinine, Ser: 0.55 mg/dL (ref 0.44–1.00)
Glucose, Bld: 102 mg/dL — ABNORMAL HIGH (ref 65–99)
POTASSIUM: 4.1 mmol/L (ref 3.5–5.1)
Sodium: 137 mmol/L (ref 135–145)
TOTAL PROTEIN: 6.4 g/dL — AB (ref 6.5–8.1)

## 2018-03-24 LAB — URINALYSIS, ROUTINE W REFLEX MICROSCOPIC
Bilirubin Urine: NEGATIVE
Glucose, UA: NEGATIVE mg/dL
HGB URINE DIPSTICK: NEGATIVE
KETONES UR: NEGATIVE mg/dL
NITRITE: NEGATIVE
PH: 5 (ref 5.0–8.0)
Protein, ur: 30 mg/dL — AB
SPECIFIC GRAVITY, URINE: 1.036 — AB (ref 1.005–1.030)

## 2018-03-24 LAB — CBC
HEMATOCRIT: 38.2 % (ref 36.0–46.0)
Hemoglobin: 12 g/dL (ref 12.0–15.0)
MCH: 25.3 pg — ABNORMAL LOW (ref 26.0–34.0)
MCHC: 31.4 g/dL (ref 30.0–36.0)
MCV: 80.4 fL (ref 78.0–100.0)
Platelets: 332 10*3/uL (ref 150–400)
RBC: 4.75 MIL/uL (ref 3.87–5.11)
RDW: 17.2 % — AB (ref 11.5–15.5)
WBC: 7.9 10*3/uL (ref 4.0–10.5)

## 2018-03-24 LAB — I-STAT BETA HCG BLOOD, ED (MC, WL, AP ONLY)

## 2018-03-24 LAB — LIPASE, BLOOD: LIPASE: 40 U/L (ref 11–51)

## 2018-03-24 MED ORDER — ONDANSETRON HCL 4 MG/2ML IJ SOLN
4.0000 mg | Freq: Once | INTRAMUSCULAR | Status: AC
  Administered 2018-03-24: 4 mg via INTRAVENOUS
  Filled 2018-03-24: qty 2

## 2018-03-24 MED ORDER — FENTANYL CITRATE (PF) 100 MCG/2ML IJ SOLN
50.0000 ug | INTRAMUSCULAR | Status: DC | PRN
  Administered 2018-03-24: 50 ug via INTRAVENOUS
  Filled 2018-03-24: qty 2

## 2018-03-24 MED ORDER — MORPHINE SULFATE (PF) 4 MG/ML IV SOLN
4.0000 mg | Freq: Once | INTRAVENOUS | Status: AC
  Administered 2018-03-24: 4 mg via INTRAVENOUS
  Filled 2018-03-24: qty 1

## 2018-03-24 MED ORDER — ONDANSETRON HCL 4 MG/2ML IJ SOLN
4.0000 mg | Freq: Once | INTRAMUSCULAR | Status: AC | PRN
  Administered 2018-03-24: 4 mg via INTRAVENOUS
  Filled 2018-03-24: qty 2

## 2018-03-24 MED ORDER — ONDANSETRON 4 MG PO TBDP: 4.0000 mg | ORAL_TABLET | Freq: Three times a day (TID) | ORAL | 0 refills | Status: DC | PRN

## 2018-03-24 MED ORDER — CEPHALEXIN 500 MG PO CAPS: 500.0000 mg | ORAL_CAPSULE | Freq: Four times a day (QID) | ORAL | 0 refills | Status: DC

## 2018-03-24 NOTE — ED Provider Notes (Signed)
[facility] EMERGENCY DEPARTMENT Provider Note   CSN: [id number] Arrival date & time: 03/24/18  0524     History   Chief Complaint Chief Complaint  Patient presents with  . Abdominal Pain    HPI Amber Warren is a 27 y.o. female G2, P2 status post cesarean section surgery on 4/26 is here for evaluation of abdominal pain.  Abdominal pain was gradual, worsening, is constantly mild but intermittently becomes more intense.  Abdominal pain is in middle, right lateral abdomen and radiates to the right flank.  Onset 10 PM last night, 2 to 3 hours after she had dinner.  Symptoms include nausea, nonbilious nonbloody emesis x1.  Non pleuritic, non exertional. Worse with palpation. She took 2 ibuprofen without improvement.  She was given Zofran and fentanyl in the ER with significant improvement however pain is now returning.  She had an OB/GYN appointment yesterday after C-section and she is doing well status post delivery/surgery.  She reports history of UTI and pyelonephritis in the past.  States that she was in the ER for similar pain several years ago when she was told it might be gastritis, she had work-up with GI and was told everything was okay.  She denies fevers, chills, chest pain, rib pain, shortness of breath, cough, urinary symptoms.  No history of kidney stones.  She is still having mild intermittent vaginal spotting from pregnancy.  LMP June/July 2018. HPI  Past Medical History:  Diagnosis Date  . Anemia   . Dysmenorrhea   . Obesity (BMI 30-39.9)   . PCO (polycystic ovaries)     Patient Active Problem List   Diagnosis Date Noted  . Cholestasis during pregnancy in third trimester 02/12/2018  . Language barrier 02/12/2018  . 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 Threatened labor at term 10/01/2014  . Supervision of high risk pregnancy, antepartum, third trimester 02/19/2014  . Overweight(278.02) 03/29/2012  . PCO (polycystic ovaries) 05/24/2011    . Oligomenorrhea 05/24/2011    Past Surgical History:  Procedure Laterality Date  . CESAREAN SECTION N/A 10/02/2014   Procedure: CESAREAN SECTION;  Surgeon: Adam Phenix, MD;  Location: [facility];  Service: Obstetrics;  Laterality: N/A;  . CESAREAN SECTION N/A 02/16/2018   Procedure: CESAREAN SECTION;  Surgeon: Tilda Burrow, MD;  Location: [facility]IRTH[facility] SUIT[facility];  Service: Obstetrics;  Laterality: N/A;  . NO PAST SURGERIES       OB History    Gravida  2   Para  2   Term  2   Preterm  0   AB  0   Living  2     SAB  0   TAB  0   Ectopic  0   Multiple  0   Live Births  2            Home Medications    Prior to Admission medications   Medication Sig Start Date End Date Taking? Authorizing Provider  famotidine (PEPCID) 20 MG tablet Take 1 tablet (20 mg total) by mouth 2 (two) times daily. 12/15/17  Yes Rolm Bookbinder, CNM  cephALEXin (KEFLEX) 500 MG capsule Take 1 capsule (500 mg total) by mouth 4 (four) times daily. 03/24/18   Liberty Handy, PA-C  ibuprofen (ADVIL,MOTRIN) 600 MG tablet Take 1 tablet (600 mg total) by mouth every 6 (six) hours. Patient not taking: Reported on 03/24/2018 02/19/18   Lise Auer, MD  ondansetron (ZOFRAN ODT) 4 MG disintegrating tablet Take 1 tablet (4  mg total) by mouth every 8 (eight) hours as needed for nausea or vomiting. 03/24/18   Liberty Handy, PA-C  oxyCODONE (OXY IR/ROXICODONE) 5 MG immediate release tablet Take 1 tablet (5 mg total) by mouth every 6 (six) hours as needed. Patient not taking: Reported on 03/24/2018 02/19/18   Lise Auer, MD  Prenatal Vit-Fe Fumarate-FA (MULTIVITAMIN-PRENATAL) 27-0.8 MG TABS tablet Take 1 tablet by mouth daily at 12 noon.    [provider]  vitamin B-6 (PYRIDOXINE) 25 MG tablet Take 1 tablet (25 mg total) daily by mouth. Patient not taking: Reported on 11/23/2017 09/08/17   Almon Hercules, MD    Family History No family history on file.  Social History Social History    Tobacco Use  . Smoking status: Never Smoker  . Smokeless tobacco: Never Used  Substance Use Topics  . Alcohol use: No  . Drug use: No     Allergies   Patient has no known allergies.   Review of Systems Review of Systems  Gastrointestinal: Positive for abdominal pain, nausea and vomiting.  Genitourinary: Positive for flank pain.  All other systems reviewed and are negative.    Physical Exam Updated Vital Signs BP 114/85   Pulse (!) 49   Temp 97.6 F (36.4 C) (Oral)   Resp 16   Ht 5\' 1"  (1.549 m)   Wt 77.1 kg (170 lb)   SpO2 98%   BMI 32.12 kg/m   Physical Exam  Constitutional: She is oriented to person, place, and time. She appears well-developed and well-nourished. No distress.  Non toxic  HENT:  Head: Normocephalic and atraumatic.  Nose: Nose normal.  Moist mucous membranes   Eyes: Pupils are equal, round, and reactive to light. Conjunctivae and EOM are normal.  Neck: Normal range of motion.  Cardiovascular: Normal rate, regular rhythm and intact distal pulses.  2+ DP and radial pulses bilaterally. No LE edema.   Pulmonary/Chest: Effort normal and breath sounds normal.  No chest wall tenderness.  No pain with deep inspiration.  Abdominal: Soft. Bowel sounds are normal. There is tenderness. There is CVA tenderness.    Mid, lateral abdominal tenderness and are CVA T.  Negative Murphy's.  No G/R/R. No suprapubic tenderness. Negative McBurney's.   Musculoskeletal: Normal range of motion.  Neurological: She is alert and oriented to person, place, and time.  Skin: Skin is warm and dry. Capillary refill takes less than 2 seconds.  Psychiatric: She has a normal mood and affect. Her behavior is normal.  Nursing note and vitals reviewed.    ED Treatments / Results  Labs (all labs ordered are listed, but only abnormal results are displayed) Labs Reviewed  COMPREHENSIVE METABOLIC PANEL - Abnormal; Notable for the following components:      Result Value    Glucose, Bld 102 (*)    Calcium 8.8 (*)    Total Protein 6.4 (*)    All other components within normal limits  CBC - Abnormal; Notable for the following components:   MCH 25.3 (*)    RDW 17.2 (*)    All other components within normal limits  URINALYSIS, ROUTINE W REFLEX MICROSCOPIC - Abnormal; Notable for the following components:   APPearance HAZY (*)    Specific Gravity, Urine 1.036 (*)    Protein, ur 30 (*)    Leukocytes, UA MODERATE (*)    Bacteria, UA RARE (*)    All other components within normal limits  URINE CULTURE  LIPASE, BLOOD  I-STAT  BETA HCG BLOOD, ED ([facility], [facility], [facility] ONLY)    EKG None  Radiology Ct Renal Stone Study  Result Date: 03/24/2018 CLINICAL DATA:  Right flank pain beginning last night. Some vomiting. EXAM: CT ABDOMEN AND PELVIS WITHOUT CONTRAST TECHNIQUE: Multidetector CT imaging of the abdomen and pelvis was performed following the standard protocol without IV contrast. COMPARISON:  None. FINDINGS: Lower chest: Clear lung bases.  Heart normal in size. Hepatobiliary: No focal liver abnormality is seen. No gallstones, gallbladder wall thickening, or biliary dilatation. Pancreas: Unremarkable. No pancreatic ductal dilatation or surrounding inflammatory changes. Spleen: Normal in size without focal abnormality. Adrenals/Urinary Tract: No adrenal masses. Kidneys are normal size, orientation and position. No renal masses. No stones. No hydronephrosis. Ureters are normal course and in caliber.  No ureteral stones. Normal bladder. Stomach/Bowel: Stomach is within normal limits. Appendix appears normal. No evidence of bowel wall thickening, distention, or inflammatory changes. Vascular/Lymphatic: No significant vascular findings are present. No enlarged abdominal or pelvic lymph nodes. Reproductive: Uterus and bilateral adnexa are unremarkable. Other: No abdominal wall hernia or abnormality. No abdominopelvic ascites. Musculoskeletal: No acute or significant osseous findings.  IMPRESSION: 1. Normal unenhanced CT scan of the abdomen pelvis. 2. No renal or ureteral stones or obstructive uropathy. No findings to account for the patient's symptoms. Electronically Signed   By: Amie Portlandavid  Ormond M.D.   On: 03/24/2018 09:23    Procedures Procedures (including critical care time)  Medications Ordered in ED Medications  fentaNYL (SUBLIMAZE) injection 50 mcg (50 mcg Intravenous Given 03/24/18 0538)  ondansetron (ZOFRAN) injection 4 mg (4 mg Intravenous Given 03/24/18 0538)  morphine 4 MG/ML injection 4 mg (4 mg Intravenous Given 03/24/18 0917)  ondansetron (ZOFRAN) injection 4 mg (4 mg Intravenous Given 03/24/18 0916)     Initial Impression / Assessment and Plan / ED Course  I have reviewed the triage vital signs and the nursing notes.  Pertinent labs & imaging results that were available during my care of the patient were reviewed by me and considered in my medical decision making (see chart for details).  Clinical Course as of Mar 24 1033  Sat Mar 24, 2018  0824 Leukocytes, UA(!): MODERATE [CG]  0824 WBC, UA: 21-50 [CG]  0824 Bacteria, UA(!): RARE [CG]  0824 Squamous Epithelial / LPF: 0-5 [CG]  0941 IMPRESSION: 1. Normal unenhanced CT scan of the abdomen pelvis. 2. No renal or ureteral stones or obstructive uropathy. No findings to account for the patient's symptoms.  CT Renal Stone Study [CG]    Clinical Course User Index [CG] Liberty HandyGibbons, Claudia J, PA-C   27 year old here with right mid/lateral abdominal pain that radiates to the right flank associated with nausea, nonbilious non-bloody emesis since last night.  Differential diagnosis includes pyelonephritis, UTI, pancreatitis, GERD/PUD, cholelithiasis, cholecystitis, kidney stone.  On exam, her tenderness is actually lower than Murphy's point.  Negative Murphy's.  No peritonitis.  She has positive right CVA tenderness.  There is no radiation into the chest or ribs, she is not having any pneumonia symptoms.  Lab work  reviewed and remarkable for moderate leukocytes, 21-50 WBCs, bacteria, 0-5 squamous epithelium most consistent with UTI.  CT renal normal.  LFTs unremarkable, given location of reproducible tenderness I have low suspicion for gallbladder issues.  RUQ US 8 months ago without stones.   Final Clinical Impressions(s) / ED Diagnoses    Reevaluated patient after analgesia and antiemetics, she feels better.  Discussed work-up and results.  Will discharge with Keflex for pyelonephritis.  I consulted  pharmacy to assist with breast-feeding safe antibiotics and this was the recommendation.  Zofran for nausea, Tylenol for pain at home.  Discussed return precautions.  Patient verbalized understanding and is in agreement. Final diagnoses:  Pyelonephritis    ED Discharge Orders        Ordered    cephALEXin (KEFLEX) 500 MG capsule  4 times daily     03/24/18 1012    ondansetron (ZOFRAN ODT) 4 MG disintegrating tablet  Every 8 hours PRN     03/24/18 1013       Liberty Handy, [place] 03/24/18 1034    Doug Sou, MD 03/24/18 [phone]

## 2018-03-24 NOTE — Discharge Instructions (Signed)
You have a urinary tract infection, this has spread to your kidneys.   Take keflex as prescribed and until completed.  You can take 1000 mg acetaminophen (tylenol) every 6-8 hours for pain. Zofran for nausea.   Return to ER if you have worsening pain, vomiting, difficulty urinating, fevers, chills

## 2018-03-24 NOTE — ED Triage Notes (Signed)
Patient presents to the Ed with complaints of right flank pain that started about 10pm. Patient reports vomiting. Patient reports pain is intermitted. Patient alert and orinted x4.

## 2018-03-26 LAB — URINE CULTURE

## 2019-06-11 IMAGING — US US OB COMP LESS 14 WK
1 series · 15 of 28 positions shown · non-contrast
Comparison: None.

CLINICAL DATA: Acute pelvic pain

EXAM:
OBSTETRIC <14 WK ULTRASOUND
TECHNIQUE: Transabdominal ultrasound was performed for evaluation of the
gestation as well as the maternal uterus and adnexal regions.

[Series 1: us ob comp less 14 wk · 15 of 51 slices shown]
[im 1/51]
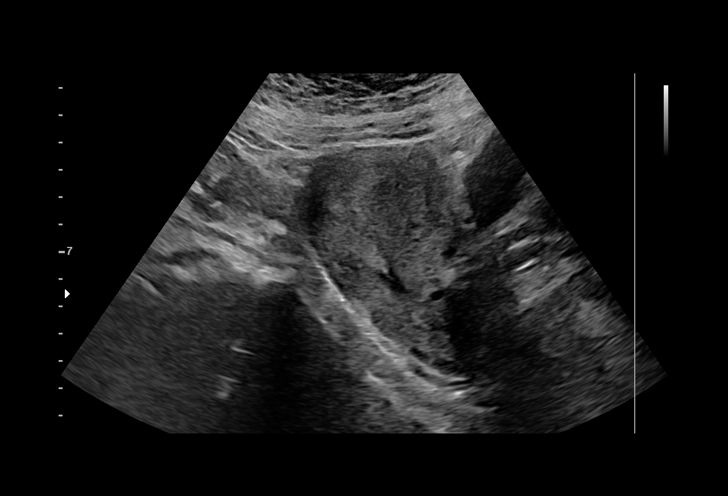
[im 4/51]
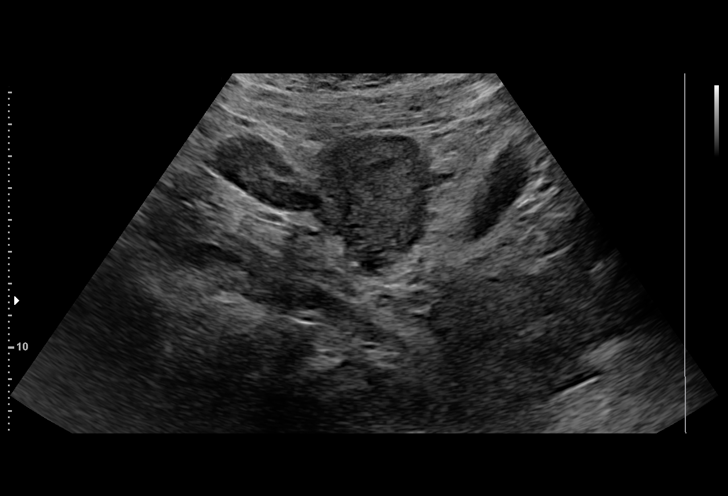
[im 8/51]
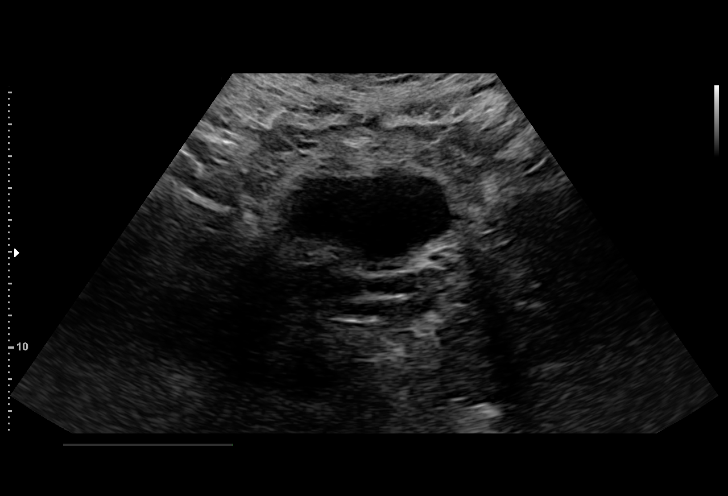
[im 12/51]
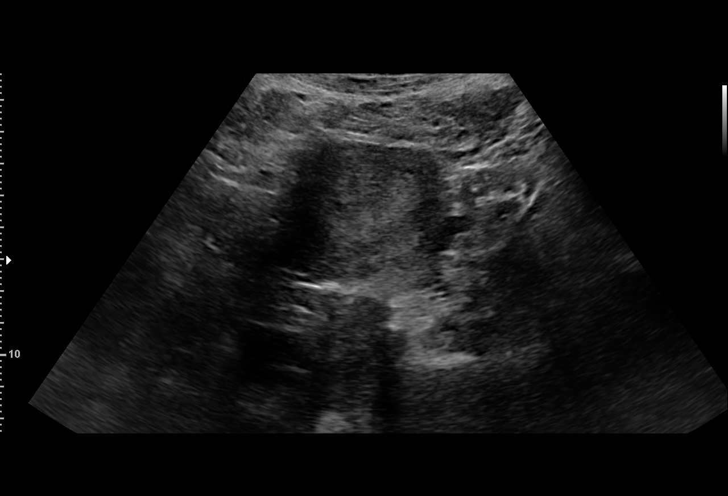
[im 15/51]
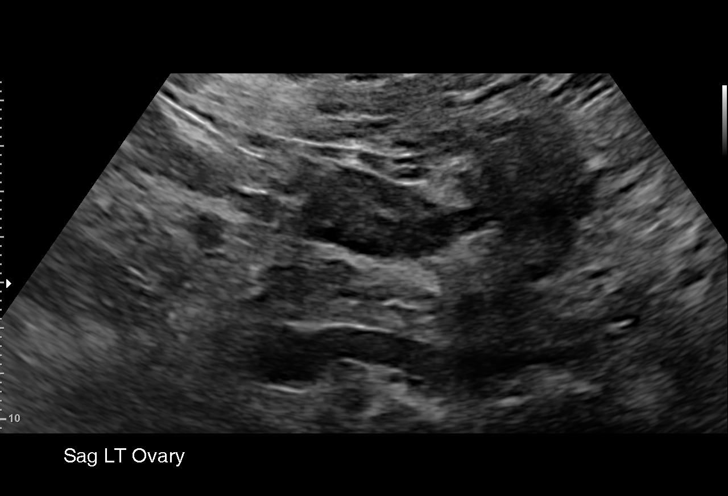
[im 19/51]
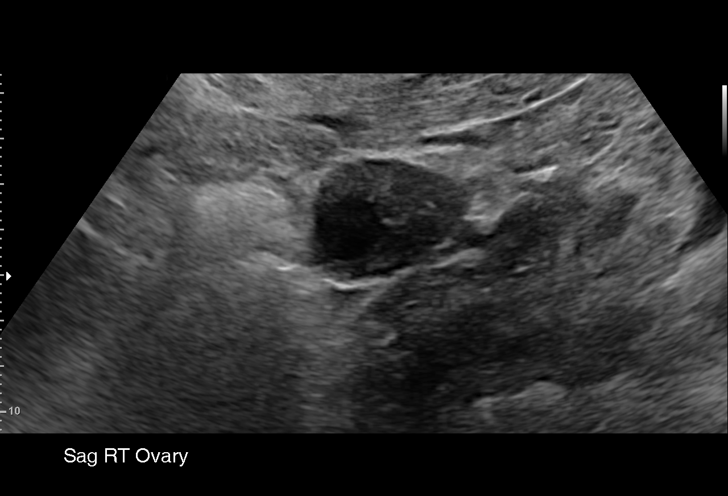
[im 23/51]
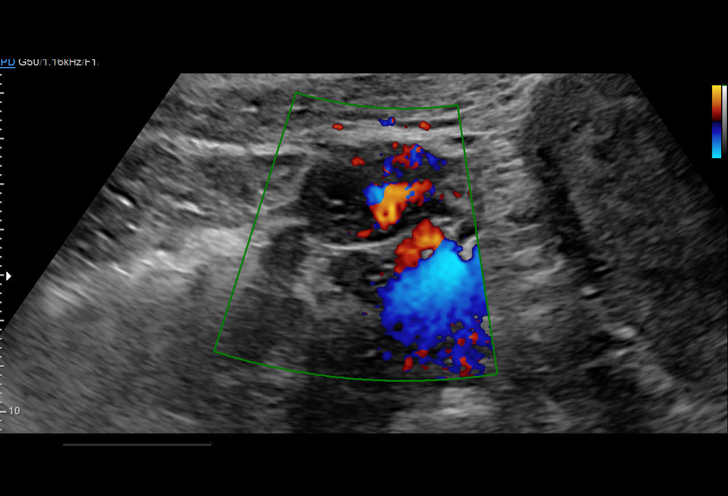
[im 26/51]
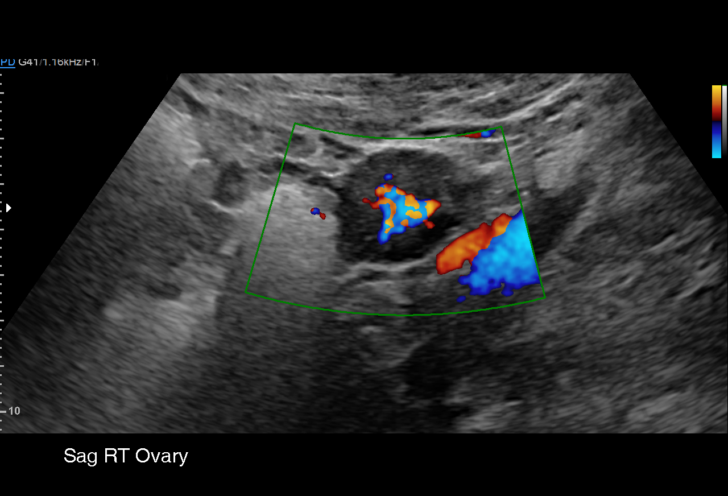
[im 28/51]
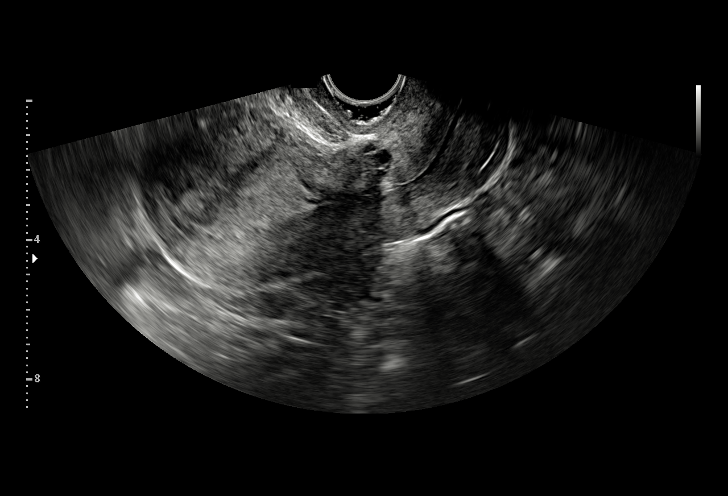
[im 32/51]
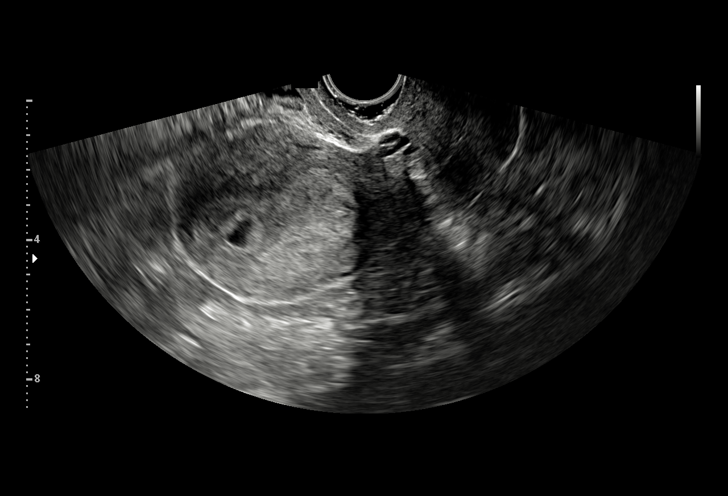
[im 36/51]
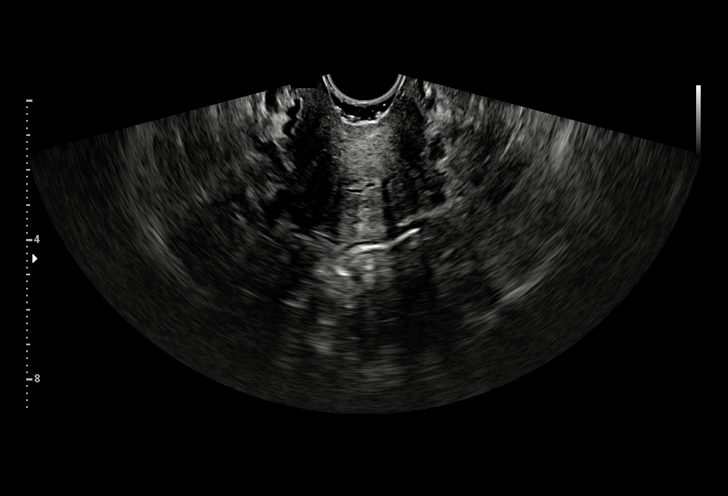
[im 39/51]
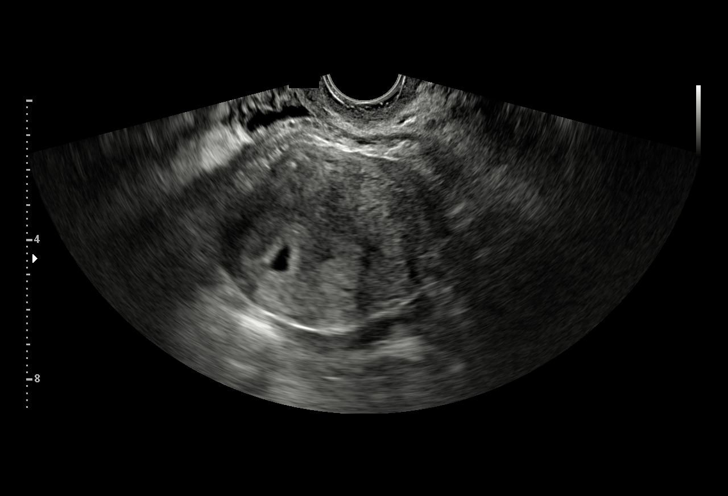
[im 43/51]
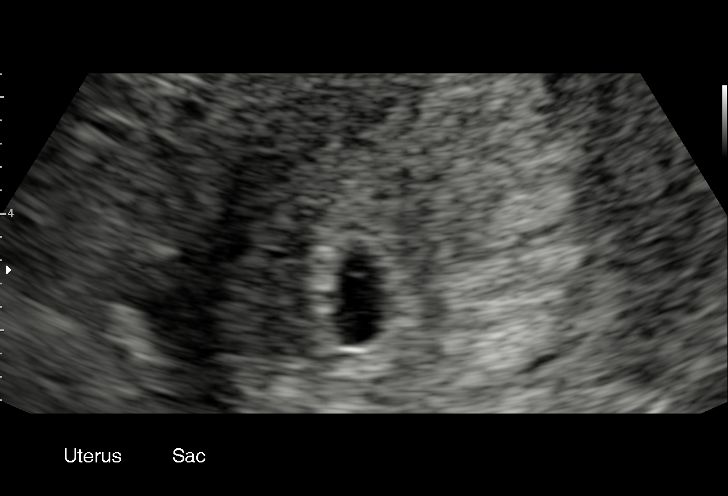
[im 47/51]
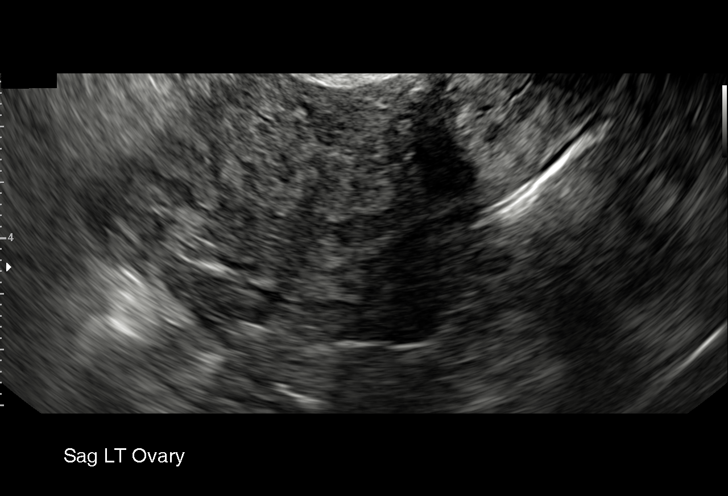
[im 51/51]
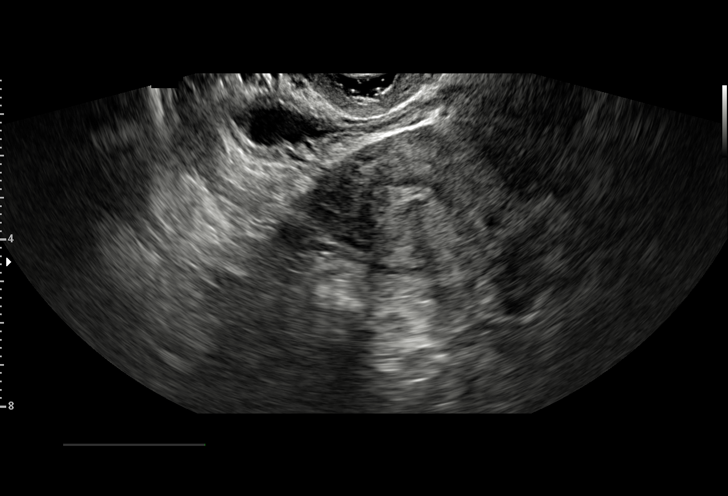

[15 of 28 positions shown; findings below may reference images not displayed]

FINDINGS: Intrauterine gestational sac: Visualized

Yolk sac:  Visualized

Embryo:  Not visualized

Cardiac Activity: Not visualized

MSD: 7 mm 5 w   2  d

Subchorionic hemorrhage:  None visualized.

Maternal uterus/adnexae: Cervical os closed. Maternal ovaries normal
in size and contour bilaterally. Corpus luteum is seen in right
ovary measuring 1.7 x 1.6 x 1.5 cm. No other extrauterine pelvic or
adnexal mass. No free pelvic fluid.
IMPRESSION: Gestational sac rather subtle yolk sac seen within the uterus. Fetal
pole not yet seen. Given this circumstance, advise follow-up
ultrasound in 10-14 days to assess for fetal pole.

Study otherwise unremarkable.  Corpus luteum seen on the right.

## 2019-11-21 ENCOUNTER — Emergency Department (HOSPITAL_COMMUNITY): Payer: Self-pay

## 2019-11-21 ENCOUNTER — Encounter (HOSPITAL_COMMUNITY): Payer: Self-pay | Admitting: Emergency Medicine

## 2019-11-21 ENCOUNTER — Emergency Department (HOSPITAL_COMMUNITY)
Admission: EM | Admit: 2019-11-21 | Discharge: 2019-11-21 | Disposition: A | Discharge status: Home / Self Care | Payer: Self-pay | Attending: Emergency Medicine | Admitting: Emergency Medicine

## 2019-11-21 DIAGNOSIS — Z79899 Other long term (current) drug therapy: Secondary | ICD-10-CM | POA: Insufficient documentation

## 2019-11-21 DIAGNOSIS — Z20822 Contact with and (suspected) exposure to covid-19: Secondary | ICD-10-CM | POA: Insufficient documentation

## 2019-11-21 DIAGNOSIS — R350 Frequency of micturition: Secondary | ICD-10-CM | POA: Insufficient documentation

## 2019-11-21 DIAGNOSIS — R110 Nausea: Secondary | ICD-10-CM | POA: Insufficient documentation

## 2019-11-21 DIAGNOSIS — R1084 Generalized abdominal pain: Secondary | ICD-10-CM | POA: Insufficient documentation

## 2019-11-21 DIAGNOSIS — R10816 Epigastric abdominal tenderness: Secondary | ICD-10-CM | POA: Insufficient documentation

## 2019-11-21 LAB — CBC
HCT: 44.6 % (ref 36.0–46.0)
Hemoglobin: 14.7 g/dL (ref 12.0–15.0)
MCH: 29.9 pg (ref 26.0–34.0)
MCHC: 33 g/dL (ref 30.0–36.0)
MCV: 90.7 fL (ref 80.0–100.0)
Platelets: 337 10*3/uL (ref 150–400)
RBC: 4.92 MIL/uL (ref 3.87–5.11)
RDW: 12.8 % (ref 11.5–15.5)
WBC: 10.9 10*3/uL — ABNORMAL HIGH (ref 4.0–10.5)
nRBC: 0 % (ref 0.0–0.2)

## 2019-11-21 LAB — COMPREHENSIVE METABOLIC PANEL
ALT: 25 U/L (ref 0–44)
AST: 22 U/L (ref 15–41)
Albumin: 4.1 g/dL (ref 3.5–5.0)
Alkaline Phosphatase: 78 U/L (ref 38–126)
Anion gap: 9 (ref 5–15)
BUN: 16 mg/dL (ref 6–20)
CO2: 22 mmol/L (ref 22–32)
Calcium: 9.3 mg/dL (ref 8.9–10.3)
Chloride: 104 mmol/L (ref 98–111)
Creatinine, Ser: 0.73 mg/dL (ref 0.44–1.00)
GFR calc Af Amer: >60 mL/min (ref >60)
GFR calc non Af Amer: >60 mL/min (ref >60)
Glucose, Bld: 90 mg/dL (ref 70–99)
Potassium: 3.8 mmol/L (ref 3.5–5.1)
Sodium: 135 mmol/L (ref 135–145)
Total Bilirubin: 0.2 mg/dL — ABNORMAL LOW (ref 0.3–1.2)
Total Protein: 7.3 g/dL (ref 6.5–8.1)

## 2019-11-21 LAB — URINALYSIS, ROUTINE W REFLEX MICROSCOPIC
Bilirubin Urine: NEGATIVE
Glucose, UA: NEGATIVE mg/dL
Ketones, ur: NEGATIVE mg/dL
Leukocytes,Ua: NEGATIVE
Nitrite: NEGATIVE
Protein, ur: NEGATIVE mg/dL
Specific Gravity, Urine: 1.025 (ref 1.005–1.030)
pH: 5 (ref 5.0–8.0)

## 2019-11-21 LAB — I-STAT BETA HCG BLOOD, ED (MC, WL, AP ONLY): I-stat hCG, quantitative: <5 m[IU]/mL (ref <5)

## 2019-11-21 LAB — LIPASE, BLOOD: Lipase: 27 U/L (ref 11–51)

## 2019-11-21 MED ORDER — SODIUM CHLORIDE 0.9% FLUSH: 3.0000 mL | Freq: Once | INTRAVENOUS | Status: DC

## 2019-11-21 MED ORDER — ONDANSETRON 4 MG PO TBDP
4.0000 mg | ORAL_TABLET | Freq: Once | ORAL | Status: AC
  Administered 2019-11-21: 20:00:00 4 mg via ORAL
  Filled 2019-11-21: qty 1

## 2019-11-21 MED ORDER — ONDANSETRON 4 MG PO TBDP: 4.0000 mg | ORAL_TABLET | Freq: Three times a day (TID) | ORAL | 0 refills | Status: DC | PRN

## 2019-11-21 MED ORDER — ALUM & MAG HYDROXIDE-SIMETH 200-200-20 MG/5ML PO SUSP
30.0000 mL | Freq: Once | ORAL | Status: AC
  Administered 2019-11-21: 21:00:00 30 mL via ORAL
  Filled 2019-11-21: qty 30

## 2019-11-21 MED ORDER — OXYCODONE-ACETAMINOPHEN 5-325 MG PO TABS
1.0000 | ORAL_TABLET | Freq: Once | ORAL | Status: AC
  Administered 2019-11-21: 1 via ORAL
  Filled 2019-11-21: qty 1

## 2019-11-21 MED ORDER — OMEPRAZOLE 20 MG PO CPDR: 20.0000 mg | DELAYED_RELEASE_CAPSULE | Freq: Every day | ORAL | 0 refills | Status: DC

## 2019-11-21 MED ORDER — LIDOCAINE VISCOUS HCL 2 % MT SOLN
15.0000 mL | Freq: Once | OROMUCOSAL | Status: AC
  Administered 2019-11-21: 15 mL via ORAL
  Filled 2019-11-21: qty 15

## 2019-11-21 NOTE — ED Notes (Signed)
Transport here to take patient to ultrasound.

## 2019-11-21 NOTE — Discharge Instructions (Addendum)
Take Prilosec as prescribed. Zofran as needed as prescribed for nausea and vomiting. Follow up with your clinic as discussed. Return to ER for new or worsening symptoms.

## 2019-11-21 NOTE — ED Triage Notes (Signed)
Pt here from home with c/o upper and lower abd pain along with som n/v

## 2019-11-21 NOTE — ED Provider Notes (Signed)
[facility] EMERGENCY DEPARTMENT Provider Note   CSN: [id number] Arrival date & time: 11/21/19  1709     History No chief complaint on file.   Amber Warren is a 29 y.o. female.  29 year old female presents with complaint of suprapubic and epigastric abdominal cramping intermittent for the past few weeks.  Patient states pain is worse if she is eating or not eating, not particularly related to any type of food that she eats, pain radiates towards her right lower ribs.  Reports associated nausea without vomiting.  Patient reports urinary frequency without dysuria and denies changes in bowel habits or vaginal discharge.  Prior abdominal surgeries include a C-section.  Patient denies history of similar pain previously.  No other complaints or concerns.  The history is limited by a language barrier. A language interpreter was used ([place]).  Abdominal Pain Pain location:  Epigastric and suprapubic Pain quality: cramping   Pain radiates to:  RUQ Pain severity:  Moderate Onset quality:  Gradual Timing:  Intermittent Progression:  Waxing and waning Chronicity:  New Context: previous surgery   Context: not alcohol use, not sick contacts and not suspicious food intake   Relieved by:  Nothing Worsened by:  Eating Ineffective treatments:  None tried Associated symptoms: nausea   Associated symptoms: no anorexia, no chest pain, no constipation, no diarrhea, no dysuria, no fever, no hematemesis, no hematochezia, no hematuria, no shortness of breath, no vaginal bleeding, no vaginal discharge and no vomiting   Risk factors: obesity   Risk factors: has not had multiple surgeries and not pregnant        Past Medical History:  Diagnosis Date  . Anemia   . Dysmenorrhea   . Obesity (BMI 30-39.9)   . PCO (polycystic ovaries)     Patient Active Problem List   Diagnosis Date Noted  . Cholestasis during pregnancy in third trimester 02/12/2018  . Language barrier  02/12/2018  . 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 Threatened labor at term 10/01/2014  . Supervision of high risk pregnancy, antepartum, third trimester 02/19/2014  . Overweight(278.02) 03/29/2012  . PCO (polycystic ovaries) 05/24/2011  . Oligomenorrhea 05/24/2011    Past Surgical History:  Procedure Laterality Date  . CESAREAN SECTION N/A 10/02/2014   Procedure: CESAREAN SECTION;  Surgeon: Adam Phenix, MD;  Location: [facility];  Service: Obstetrics;  Laterality: N/A;  . CESAREAN SECTION N/A 02/16/2018   Procedure: CESAREAN SECTION;  Surgeon: Tilda Burrow, MD;  Location: [facility]IRTHING SU[facility];  Service: Obstetrics;  Laterality: N/A;  . NO PAST SURGERIES       OB History    Gravida  2   Para  2   Term  2   Preterm  0   AB  0   Living  2     SAB  0   TAB  0   Ectopic  0   Multiple  0   Live Births  2           History reviewed. No pertinent family history.  Social History   Tobacco Use  . Smoking status: Never Smoker  . Smokeless tobacco: Never Used  Substance Use Topics  . Alcohol use: No  . Drug use: No    Home Medications Prior to Admission medications   Medication Sig Start Date End Date Taking? Authorizing Provider  cephALEXin (KEFLEX) 500 MG capsule Take 1 capsule (500 mg total) by mouth 4 (four) times daily. Patient not taking: Reported on 11/21/2019 03/24/18  Liberty Handy, PA-C  famotidine (PEPCID) 20 MG tablet Take 1 tablet (20 mg total) by mouth 2 (two) times daily. Patient not taking: Reported on 11/21/2019 12/15/17   Rolm Bookbinder, CNM  ibuprofen (ADVIL,MOTRIN) 600 MG tablet Take 1 tablet (600 mg total) by mouth every 6 (six) hours. Patient not taking: Reported on 03/24/2018 02/19/18   Lise Auer, MD  omeprazole (PRILOSEC) 20 MG capsule Take 1 capsule (20 mg total) by mouth daily. 11/21/19 12/21/19  Jeannie Fend, PA-C  ondansetron (ZOFRAN ODT) 4 MG disintegrating tablet Take 1 tablet (4 mg total) by mouth every 8 (eight)  hours as needed for nausea or vomiting. 11/21/19   [place] Melia A, PA-C  oxyCODONE (OXY IR/ROXICODONE) 5 MG immediate release tablet Take 1 tablet (5 mg total) by mouth every 6 (six) hours as needed. Patient not taking: Reported on 03/24/2018 02/19/18   Lise Auer, MD  vitamin B-6 (PYRIDOXINE) 25 MG tablet Take 1 tablet (25 mg total) daily by mouth. Patient not taking: Reported on 11/23/2017 09/08/17   Almon Hercules, MD    Allergies    Patient has no known allergies.  Review of Systems   Review of Systems  Constitutional: Negative for fever.  Respiratory: Negative for shortness of breath.   Cardiovascular: Negative for chest pain.  Gastrointestinal: Positive for abdominal pain and nausea. Negative for anorexia, constipation, diarrhea, hematemesis, hematochezia and vomiting.  Genitourinary: Positive for frequency. Negative for dysuria, hematuria, vaginal bleeding and vaginal discharge.  Musculoskeletal: Negative for arthralgias and myalgias.  Skin: Negative for rash and wound.  Allergic/Immunologic: Negative for immunocompromised state.  Neurological: Negative for weakness.  All other systems reviewed and are negative.   Physical Exam Updated Vital Signs BP 114/64   Pulse 70   Temp 99.1 F (37.3 C) (Oral)   Resp 17   SpO2 100%   Physical Exam Vitals and nursing note reviewed.  Constitutional:      General: She is not in acute distress.    Appearance: She is well-developed. She is obese. She is not diaphoretic.  HENT:     Head: Normocephalic and atraumatic.  Cardiovascular:     Rate and Rhythm: Normal rate and regular rhythm.     Pulses: Normal pulses.     Heart sounds: Normal heart sounds.  Pulmonary:     Effort: Pulmonary effort is normal.     Breath sounds: Normal breath sounds.  Abdominal:     Palpations: Abdomen is soft.     Tenderness: There is abdominal tenderness in the right upper quadrant, epigastric area and suprapubic area.  Skin:    General: Skin is  warm and dry.     Findings: No erythema or rash.  Neurological:     Mental Status: She is alert and oriented to person, place, and time.  Psychiatric:        Behavior: Behavior normal.     ED Results / Procedures / Treatments   Labs (all labs ordered are listed, but only abnormal results are displayed) Labs Reviewed  COMPREHENSIVE METABOLIC PANEL - Abnormal; Notable for the following components:      Result Value   Total Bilirubin 0.2 (*)    All other components within normal limits  CBC - Abnormal; Notable for the following components:   WBC 10.9 (*)    All other components within normal limits  URINALYSIS, ROUTINE W REFLEX MICROSCOPIC - Abnormal; Notable for the following components:   Hgb urine dipstick SMALL (*)  Bacteria, UA RARE (*)    All other components within normal limits  NOVEL CORONAVIRUS, NAA (HOSP ORDER, SEND-OUT TO REF LAB; TAT 18-24 HRS)  LIPASE, BLOOD  I-STAT BETA HCG BLOOD, ED ([facility], [facility], [facility] ONLY)    EKG None  Radiology US Abdomen Limited  Result Date: 11/21/2019 CLINICAL DATA:  Right upper quadrant pain EXAM: ULTRASOUND ABDOMEN LIMITED RIGHT UPPER QUADRANT COMPARISON:  08/08/2017 FINDINGS: Gallbladder: No gallstones or wall thickening visualized. No sonographic Tane Biegler sign noted by sonographer. Common bile duct: Diameter: Normal caliber, 5 mm Liver: No focal lesion identified. Within normal limits in parenchymal echogenicity. Portal vein is patent on color Doppler imaging with normal direction of blood flow towards the liver. Other: None. IMPRESSION: Unremarkable right upper quadrant ultrasound. Electronically Signed   By: Rolm Baptise M.D.   On: 11/21/2019 19:57    Procedures Procedures (including critical care time)  Medications Ordered in ED Medications  sodium chloride flush (NS) 0.9 % injection 3 mL (3 mLs Intravenous Not Given 11/21/19 1822)  alum & mag hydroxide-simeth (MAALOX/MYLANTA) 200-200-20 MG/5ML suspension 30 mL (has no administration in  time range)    And  lidocaine (XYLOCAINE) 2 % viscous mouth solution 15 mL (has no administration in time range)  ondansetron (ZOFRAN-ODT) disintegrating tablet 4 mg (4 mg Oral Given 11/21/19 1937)  oxyCODONE-acetaminophen (PERCOCET/ROXICET) 5-325 MG per tablet 1 tablet (1 tablet Oral Given 11/21/19 1936)    ED Course  I have reviewed the triage vital signs and the nursing notes.  Pertinent labs & imaging results that were available during my care of the patient were reviewed by me and considered in my medical decision making (see chart for details).  Clinical Course as of Nov 21 2051  Collier Salina 29yo female with complaint of epigastric and suprapubic cramping, worse somewhat with eating and radiates to RUQ. Labs without significant findings, [place] RUQ unremarkable. Patient was given Percocet and Zofran without much change in her pain. Will give GI cocktail prior to discharge. Return to ER for worsening or concerning symptoms, recommend bland, rx for prilosec. Patient to follow up with PCP, return to ER for new or worsening symptoms.    [LM]  2052 Patient is concerned about COVID, states she called a clinic to make an appointment and they would not see her due to concern for COVID related symptoms. Will add a COVID test so that patient can obtain follow up at her clinic, low suspicion for COVID today.   [LM]    Clinical Course User Index [LM] Roque Lias   MDM Rules/Calculators/A&P                      Final Clinical Impression(s) / ED Diagnoses Final diagnoses:  Generalized abdominal pain    Rx / DC Orders ED Discharge Orders         Ordered    omeprazole (PRILOSEC) 20 MG capsule  Daily     11/21/19 2041    ondansetron (ZOFRAN ODT) 4 MG disintegrating tablet  Every 8 hours PRN     11/21/19 2043           Tacy Learn, PA-C 11/21/19 2053    Varney Biles, MD 11/22/19 1620

## 2019-11-21 NOTE — ED Notes (Signed)
Patient educated not to drive after receiving pain medication.  States she called her husband and he is picking her up

## 2019-11-23 LAB — NOVEL CORONAVIRUS, NAA (HOSP ORDER, SEND-OUT TO REF LAB; TAT 18-24 HRS): SARS-CoV-2, NAA: NOT DETECTED

## 2020-03-25 ENCOUNTER — Other Ambulatory Visit: Payer: Self-pay

## 2020-03-25 DIAGNOSIS — N644 Mastodynia: Secondary | ICD-10-CM

## 2020-04-14 ENCOUNTER — Ambulatory Visit
Admission: RE | Admit: 2020-04-14 | Discharge: 2020-04-14 | Disposition: A | Discharge status: Home / Self Care | Payer: No Typology Code available for payment source | Source: Ambulatory Visit | Attending: Obstetrics and Gynecology | Admitting: Obstetrics and Gynecology

## 2020-04-14 ENCOUNTER — Other Ambulatory Visit: Payer: Self-pay

## 2020-04-14 ENCOUNTER — Ambulatory Visit: Payer: Self-pay

## 2020-04-14 ENCOUNTER — Ambulatory Visit: Payer: Self-pay | Admitting: *Deleted

## 2020-04-14 VITALS — BP 109/74 | Temp 98.4°F | Wt 179.0 lb

## 2020-04-14 DIAGNOSIS — Z1239 Encounter for other screening for malignant neoplasm of breast: Secondary | ICD-10-CM

## 2020-04-14 DIAGNOSIS — N644 Mastodynia: Secondary | ICD-10-CM

## 2020-04-14 NOTE — Progress Notes (Signed)
[name] is a 29 y.o. female who presents to [facility] clinic today with complaints of left lower breast pain x 4.5 months. Patient rates the pain at a 9 out of 10.   Pap Smear: Pap not smear completed today. Last Pap smear was 11/09/2017 at [facility]) and was normal with negative HPV. Per patient has no history of an abnormal Pap smear. Last Pap smear result is not available in Epic but her records have been obtained from [facility].   Physical exam: Breasts Breasts symmetrical. No skin abnormalities bilateral breasts. No nipple retraction bilateral breasts. No nipple discharge bilateral breasts. No lymphadenopathy. No lumps palpated bilateral breasts. Patient complains of pain on palpation in her left lower breast under her nipple at 7 o'clock and 8 o'clock. She states that the pain has been going on for 4 1/2 months. Small area of redness noticed on her right breast at 4 o'clock which the patient states she has never noticed and denies any associated pain, discomfort, or itching.     Pelvic/Bimanual Pap is not indicated today.    Smoking History: Patient has never smoked.    Patient Navigation: Patient education provided. Access to services provided for patient through BCCCP program. Amber Warren, Spanish interpreter, provided through [facility].   Breast and Cervical Cancer Risk Assessment: Patient does not have family history of breast cancer, known genetic mutations, or radiation treatment to the chest before age 76. Patient does not have history of cervical dysplasia, immunocompromised, or DES exposure in-utero. Breast cancer risk assessment completed. No breast cancer risk calculated due to patient is less than 48 years old.  A: BCCCP exam without pap smear Complains of left lower breast pain.  P: Referred patient to the [facility] for a diagnostic mammogram. Appointment scheduled April 14, 2020 at 2:40pm.  Amber Homer, RN,  FNP student 04/14/2020 2:31 PM   Attestation of Supervision of Student:  I confirm that I have verified the information documented in the nurse practitioner student's note and that I have also personally reperformed the history, physical exam and all medical decision making activities.  I have verified that all services and findings are accurately documented in this student's note; and I agree with management and plan as outlined in the documentation. I have also made any necessary editorial changes.  Complaints of left lower breast tenderness on exam.  Amber Warren, Amber Maser, RN Center for [employer], [facility] 04/14/2020 3:07 PM

## 2020-04-14 NOTE — Patient Instructions (Addendum)
Informed Amber Warren about breast self awareness and gave educational materials to take home. Patient did not need a Pap smear today due to last Pap smear was in 2019 per patient. Let her know B[facility]CP will cover Pap smears and HPV typing every 5 years unless has a history of abnormal Pap smears. Referred patient to the [facility] for diagnostic mammogram. Appointment scheduled for April 14, 2020 at 2:40pm. Patient aware of appointment and will be there. [name] verbalized understanding.  Mila Homer, RN, FNP student 2:40 PM

## 2020-05-03 ENCOUNTER — Encounter (HOSPITAL_COMMUNITY): Payer: Self-pay | Admitting: Emergency Medicine

## 2020-05-03 ENCOUNTER — Other Ambulatory Visit: Payer: Self-pay

## 2020-05-03 ENCOUNTER — Emergency Department (HOSPITAL_COMMUNITY)
Admission: EM | Admit: 2020-05-03 | Discharge: 2020-05-03 | Disposition: A | Discharge status: Home / Self Care | Payer: No Typology Code available for payment source | Attending: Emergency Medicine | Admitting: Emergency Medicine

## 2020-05-03 ENCOUNTER — Emergency Department (HOSPITAL_COMMUNITY): Payer: No Typology Code available for payment source

## 2020-05-03 DIAGNOSIS — N83209 Unspecified ovarian cyst, unspecified side: Secondary | ICD-10-CM

## 2020-05-03 DIAGNOSIS — R102 Pelvic and perineal pain: Secondary | ICD-10-CM

## 2020-05-03 DIAGNOSIS — N83201 Unspecified ovarian cyst, right side: Secondary | ICD-10-CM | POA: Insufficient documentation

## 2020-05-03 DIAGNOSIS — Z79899 Other long term (current) drug therapy: Secondary | ICD-10-CM | POA: Insufficient documentation

## 2020-05-03 LAB — COMPREHENSIVE METABOLIC PANEL
ALT: 23 U/L (ref 0–44)
AST: 19 U/L (ref 15–41)
Albumin: 3.5 g/dL (ref 3.5–5.0)
Alkaline Phosphatase: 66 U/L (ref 38–126)
Anion gap: 7 (ref 5–15)
BUN: 14 mg/dL (ref 6–20)
CO2: 21 mmol/L — ABNORMAL LOW (ref 22–32)
Calcium: 8.6 mg/dL — ABNORMAL LOW (ref 8.9–10.3)
Chloride: 107 mmol/L (ref 98–111)
Creatinine, Ser: 0.5 mg/dL (ref 0.44–1.00)
GFR calc Af Amer: >60 mL/min (ref >60)
GFR calc non Af Amer: >60 mL/min (ref >60)
Glucose, Bld: 99 mg/dL (ref 70–99)
Potassium: 3.9 mmol/L (ref 3.5–5.1)
Sodium: 135 mmol/L (ref 135–145)
Total Bilirubin: 0.2 mg/dL — ABNORMAL LOW (ref 0.3–1.2)
Total Protein: 6.6 g/dL (ref 6.5–8.1)

## 2020-05-03 LAB — CBC
HCT: 40.5 % (ref 36.0–46.0)
Hemoglobin: 13.3 g/dL (ref 12.0–15.0)
MCH: 29.2 pg (ref 26.0–34.0)
MCHC: 32.8 g/dL (ref 30.0–36.0)
MCV: 89 fL (ref 80.0–100.0)
Platelets: 277 10*3/uL (ref 150–400)
RBC: 4.55 MIL/uL (ref 3.87–5.11)
RDW: 13.2 % (ref 11.5–15.5)
WBC: 9.8 10*3/uL (ref 4.0–10.5)
nRBC: 0 % (ref 0.0–0.2)

## 2020-05-03 LAB — WET PREP, GENITAL
Clue Cells Wet Prep HPF POC: NONE SEEN
Sperm: NONE SEEN
Trich, Wet Prep: NONE SEEN
Yeast Wet Prep HPF POC: NONE SEEN

## 2020-05-03 LAB — I-STAT BETA HCG BLOOD, ED (MC, WL, AP ONLY): I-stat hCG, quantitative: <5 m[IU]/mL (ref <5)

## 2020-05-03 LAB — URINALYSIS, ROUTINE W REFLEX MICROSCOPIC
Bilirubin Urine: NEGATIVE
Glucose, UA: NEGATIVE mg/dL
Hgb urine dipstick: NEGATIVE
Ketones, ur: 5 mg/dL — AB
Leukocytes,Ua: NEGATIVE
Nitrite: NEGATIVE
Protein, ur: NEGATIVE mg/dL
Specific Gravity, Urine: 1.018 (ref 1.005–1.030)
pH: 5 (ref 5.0–8.0)

## 2020-05-03 LAB — LIPASE, BLOOD: Lipase: 30 U/L (ref 11–51)

## 2020-05-03 MED ORDER — HYDROCODONE-ACETAMINOPHEN 5-325 MG PO TABS: 1.0000 | ORAL_TABLET | ORAL | 0 refills | Status: DC | PRN

## 2020-05-03 MED ORDER — ONDANSETRON HCL 4 MG/2ML IJ SOLN
4.0000 mg | Freq: Once | INTRAMUSCULAR | Status: AC
  Administered 2020-05-03: 4 mg via INTRAVENOUS
  Filled 2020-05-03: qty 2

## 2020-05-03 MED ORDER — HYDROCODONE-ACETAMINOPHEN 5-325 MG PO TABS
1.0000 | ORAL_TABLET | Freq: Once | ORAL | Status: AC
  Administered 2020-05-03: 1 via ORAL
  Filled 2020-05-03: qty 1

## 2020-05-03 MED ORDER — HYDROMORPHONE HCL 1 MG/ML IJ SOLN
0.5000 mg | Freq: Once | INTRAMUSCULAR | Status: AC
  Administered 2020-05-03: 0.5 mg via INTRAVENOUS
  Filled 2020-05-03: qty 1

## 2020-05-03 MED ORDER — SODIUM CHLORIDE 0.9% FLUSH: 3.0000 mL | Freq: Once | INTRAVENOUS | Status: DC

## 2020-05-03 MED ORDER — KETOROLAC TROMETHAMINE 15 MG/ML IJ SOLN
15.0000 mg | Freq: Once | INTRAMUSCULAR | Status: AC
  Administered 2020-05-03: 15 mg via INTRAVENOUS
  Filled 2020-05-03: qty 1

## 2020-05-03 MED ORDER — ONDANSETRON 4 MG PO TBDP
4.0000 mg | ORAL_TABLET | Freq: Three times a day (TID) | ORAL | 0 refills | Status: DC | PRN
Start: 2020-05-03 — End: 2022-04-22

## 2020-05-03 MED ORDER — IOHEXOL 300 MG/ML  SOLN
100.0000 mL | Freq: Once | INTRAMUSCULAR | Status: AC | PRN
  Administered 2020-05-03: 100 mL via INTRAVENOUS

## 2020-05-03 MED ORDER — MORPHINE SULFATE (PF) 4 MG/ML IV SOLN
4.0000 mg | Freq: Once | INTRAVENOUS | Status: AC
  Administered 2020-05-03: 4 mg via INTRAVENOUS
  Filled 2020-05-03: qty 1

## 2020-05-03 NOTE — ED Triage Notes (Signed)
Pt reports lower abd pain that started today.  Denies nausea, vomiting, and diarrhea.

## 2020-05-03 NOTE — Discharge Instructions (Addendum)
Contine con ibuprofeno 800 mg para el dolor cada 8 horas. Tomar con la comida Tome Norco para el dolor intenso si es necesario Tome Zofran segn sea necesario para las nuseas Use una almohadilla trmica en el abdomen para ayudar con el dolor. Haga un seguimiento con la clnica de la mujer si contina teniendo dolor

## 2020-05-03 NOTE — ED Provider Notes (Signed)
[facility] EMERGENCY DEPARTMENT Provider Note   CSN: [id number] Arrival date & time: 05/03/20  1314     History Chief Complaint  Patient presents with  . Abdominal Pain    Ortencia Clary Boulais is a 29 y.o. female with past medical history significant for anemia, dysmenorrhea, obesity, polycystic ovaries.  Abdominal surgical history includes cesarean sections x2.    HPI Presents to emergency room today with chief complaint of intermittent lower abdominal pain. X 1 week. Patient states pain is located in suprapubic area and radiates to her right upper and lower quadrants. She describes pain as sharp and cramping. She saw pcp x 5 days ago and was negative for UTI and ibuprofen was recommend for pain. She states despite taking th ibuprofen her pain persisted. She has not been sexually active in x7 months and not concerned for STIs. She admits to passing flatus. Last bowel movement was yesterday and was normal. She denies fever, chills, cough, shortness of breath, chest pain, nausea, emesis, flank pain, dysuria, urinary frequency, gross hematuria, diarrhea. Last PO intake at 930 AM.   Due to language barrier, a video interpreter was present during the history-taking and subsequent discussion (and for part of the physical exam) with this patient.     Past Medical History:  Diagnosis Date  . Anemia   . Dysmenorrhea   . Obesity (BMI 30-39.9)   . PCO (polycystic ovaries)     Patient Active Problem List   Diagnosis Date Noted  . Cholestasis during pregnancy in third trimester 02/12/2018  . Language barrier 02/12/2018  . 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 Threatened labor at term 10/01/2014  . Supervision of high risk pregnancy, antepartum, third trimester 02/19/2014  . Overweight(278.02) 03/29/2012  . PCO (polycystic ovaries) 05/24/2011  . Oligomenorrhea 05/24/2011    Past Surgical History:  Procedure Laterality Date  . CESAREAN SECTION N/A 10/02/2014     Procedure: CESAREAN SECTION;  Surgeon: Adam Phenix, MD;  Location: [facility];  Service: Obstetrics;  Laterality: N/A;  . CESAREAN SECTION N/A 02/16/2018   Procedure: CESAREAN SECTION;  Surgeon: Tilda Burrow, MD;  Location: [facility] BIRTHING SUITES;  Service: Obstetrics;  Laterality: N/A;  . NO PAST SURGERIES       OB History    Gravida  2   Para  2   Term  2   Preterm  0   AB  0   Living  2     SAB  0   TAB  0   Ectopic  0   Multiple  0   Live Births  2           No family history on file.  Social History   Tobacco Use  . Smoking status: Never Smoker  . Smokeless tobacco: Never Used  Vaping Use  . Vaping Use: Never used  Substance Use Topics  . Alcohol use: No  . Drug use: No    Home Medications Prior to Admission medications   Medication Sig Start Date End Date Taking? Authorizing Provider  cephALEXin (KEFLEX) 500 MG capsule Take 1 capsule (500 mg total) by mouth 4 (four) times daily. Patient not taking: Reported on 11/21/2019 03/24/18   Liberty Handy, PA-C  famotidine (PEPCID) 20 MG tablet Take 1 tablet (20 mg total) by mouth 2 (two) times daily. Patient not taking: Reported on 11/21/2019 12/15/17   Rolm Bookbinder, CNM  ibuprofen (ADVIL,MOTRIN) 600 MG tablet Take 1 tablet (600 mg total) by  mouth every 6 (six) hours. Patient not taking: Reported on 03/24/2018 02/19/18   Lise Auer, MD  omeprazole (PRILOSEC) 20 MG capsule Take 1 capsule (20 mg total) by mouth daily. 11/21/19 12/21/19  Jeannie Fend, PA-C  ondansetron (ZOFRAN ODT) 4 MG disintegrating tablet Take 1 tablet (4 mg total) by mouth every 8 (eight) hours as needed for nausea or vomiting. 11/21/19   Army Melia A, PA-C  oxyCODONE (OXY IR/ROXICODONE) 5 MG immediate release tablet Take 1 tablet (5 mg total) by mouth every 6 (six) hours as needed. Patient not taking: Reported on 03/24/2018 02/19/18   Lise Auer, MD  vitamin B-6 (PYRIDOXINE) 25 MG tablet Take 1 tablet (25 mg total) daily  by mouth. Patient not taking: Reported on 11/23/2017 09/08/17   Almon Hercules, MD    Allergies    Patient has no known allergies.  Review of Systems   Review of Systems  All other systems are reviewed and are negative for acute change except as noted in the HPI.   Physical Exam Updated Vital Signs BP 116/76 (BP Location: Left Arm)   Pulse 61   Temp 98.6 F (37 C) (Oral)   Resp 16   LMP 04/10/2020 (Exact Date)   SpO2 100%   Physical Exam Vitals and nursing note reviewed.  Constitutional:      General: She is not in acute distress.    Appearance: She is not ill-appearing.  HENT:     Head: Normocephalic and atraumatic.     Right Ear: Tympanic membrane and external ear normal.     Left Ear: Tympanic membrane and external ear normal.     Nose: Nose normal.     Mouth/Throat:     Mouth: Mucous membranes are moist.     Pharynx: Oropharynx is clear.  Eyes:     General: No scleral icterus.       Right eye: No discharge.        Left eye: No discharge.     Extraocular Movements: Extraocular movements intact.     Conjunctiva/sclera: Conjunctivae normal.     Pupils: Pupils are equal, round, and reactive to light.  Neck:     Vascular: No JVD.  Cardiovascular:     Rate and Rhythm: Normal rate and regular rhythm.     Pulses: Normal pulses.          Radial pulses are 2+ on the right side and 2+ on the left side.     Heart sounds: Normal heart sounds.  Pulmonary:     Comments: Lungs clear to auscultation in all fields. Symmetric chest rise. No wheezing, rales, or rhonchi. Abdominal:     General: Bowel sounds are normal.     Tenderness: There is no right CVA tenderness or left CVA tenderness.     Comments: Abdomen is soft, non-distended, tenderness to palpation of RLQ with voluntary guarding. No rigidity. No peritoneal signs.  Genitourinary:    Comments: Normal external genitalia. No pain with speculum insertion. Closed cervical os with normal appearance - no rash or lesions. No  significant discharge or bleeding noted from cervix or in vaginal vault. On bimanual examination no adnexal tenderness or cervical motion tenderness. Chaperone Breana NT present during exam.  Musculoskeletal:        General: Normal range of motion.     Cervical back: Normal range of motion.  Skin:    General: Skin is warm and dry.     Capillary Refill: Capillary refill takes  less than 2 seconds.  Neurological:     Mental Status: She is oriented to person, place, and time.     GCS: GCS eye subscore is 4. GCS verbal subscore is 5. GCS motor subscore is 6.     Comments: Fluent speech, no facial droop.  Psychiatric:        Behavior: Behavior normal.     ED Results / Procedures / Treatments   Labs (all labs ordered are listed, but only abnormal results are displayed) Labs Reviewed  WET PREP, GENITAL - Abnormal; Notable for the following components:      Result Value   WBC, Wet Prep HPF POC MODERATE (*)    All other components within normal limits  COMPREHENSIVE METABOLIC PANEL - Abnormal; Notable for the following components:   CO2 21 (*)    Calcium 8.6 (*)    Total Bilirubin 0.2 (*)    All other components within normal limits  URINALYSIS, ROUTINE W REFLEX MICROSCOPIC - Abnormal; Notable for the following components:   Ketones, ur 5 (*)    All other components within normal limits  LIPASE, BLOOD  CBC  I-STAT BETA HCG BLOOD, ED ([facility], [facility], [facility] ONLY)  GC/CHLAMYDIA PROBE AMP ([place]) NOT AT [facility]    EKG None  Radiology US PELVIC COMPLETE W TRANSVAGINAL AND TORSION R/O  Result Date: 05/03/2020 CLINICAL DATA:  29 year old female with lower abdominal pain, pelvic pain. EXAM: TRANSABDOMINAL AND TRANSVAGINAL ULTRASOUND OF PELVIS DOPPLER ULTRASOUND OF OVARIES TECHNIQUE: Both transabdominal and transvaginal ultrasound examinations of the pelvis were performed. Transabdominal technique was performed for global imaging of the pelvis including uterus, ovaries, adnexal regions, and pelvic  cul-de-sac. It was necessary to proceed with endovaginal exam following the transabdominal exam to visualize the ovaries. Color and duplex Doppler ultrasound was utilized to evaluate blood flow to the ovaries. COMPARISON:  noncontrast CT Abdomen and Pelvis 03/24/2018 Ob ultrasound 07/25/2017. FINDINGS: Uterus Measurements: 9.9 x 4.0 x 5.4 cm = volume: 109 mL. No fibroids or other mass visualized. Endometrium Thickness: 6 mm.  No focal abnormality visualized. Right ovary Measurements: 2.4 x 2.0 x 2.3 cm = volume: 6 mL. Normal appearance/no adnexal mass. Left ovary Measurements: 3.2 x 2.2 x 4.2 cm = volume: 15 mL. Better visualized transabdominally. Normal appearance/no adnexal mass. Pulsed Doppler evaluation of both ovaries demonstrates normal low-resistance arterial and venous waveforms. Other findings No pelvic free fluid. IMPRESSION: Negative.  No evidence of ovarian torsion. Electronically Signed   By: Odessa Fleming M.D.   On: 05/03/2020 21:00    Procedures Procedures (including critical care time)  Medications Ordered in ED Medications  sodium chloride flush (NS) 0.9 % injection 3 mL (3 mLs Intravenous Not Given 05/03/20 2000)  HYDROmorphone (DILAUDID) injection 0.5 mg (has no administration in time range)  morphine 4 MG/ML injection 4 mg (4 mg Intravenous Given 05/03/20 2014)  ondansetron (ZOFRAN) injection 4 mg (4 mg Intravenous Given 05/03/20 2010)    ED Course  I have reviewed the triage vital signs and the nursing notes.  Pertinent labs & imaging results that were available during my care of the patient were reviewed by me and considered in my medical decision making (see chart for details).    MDM Rules/Calculators/A&P                          History provided by patient with additional history obtained from chart review.    Patient presents to the ED with complaints  of abdominal pain. Patient nontoxic appearing, in no apparent distress, vitals WNL. On exam patient tender to right lower  quadrant, no peritoneal signs. Will evaluate with labs and pelvic. Analgesics and anti-emetics administered.   Labs reviewed and grossly unremarkable. No leukocytosis, no anemia, no significant electrolyte derangements. LFTs, renal function, and lipase WNL. Urinalysis without obvious infection. Pregnancy test is negative. Pelvic exam performed with chaperone present. There was no cervical motion tenderness, no adnexal  tenderness. US torsion study is negative. Patient declines need for prophylactic STI treatment. She is aware if positive results she will need to inform partners and be treated.  On repeat abdominal exam patient continues to have tenderness to RLQ. Second dose of pain medicine given and CT A/P ordered.  Patient care transferred to K. Gekas PA-C at the end of my shift pending CT A/P and PO challenge. Patient presentation, ED course, and plan of care discussed with review of all pertinent labs and imaging. Please see her note for further details regarding further ED course and disposition. Anticipate discharge home if CT negative, pain improves and tolerating PO intake.   Portions of this note were generated with Scientist, clinical (histocompatibility and immunogenetics)Dragon dictation software. Dictation errors may occur despite best attempts at proofreading.    Final Clinical Impression(s) / ED Diagnoses Final diagnoses:  Pelvic pain    Rx / DC Orders ED Discharge Orders    None       Kathyrn Lasslbrizze, Alvine Mostafa E, PA-C 05/03/20 2126    Derwood KaplanNanavati, Ankit, MD 05/05/20 1349

## 2020-05-03 NOTE — ED Provider Notes (Signed)
29 year old with lower abdominal pain for one week in the suprapubic area and RLQ. Labs are reassuring. UA is normal. Wet prep is normal and pt is not recently sexually active. Pelvic US was obtained which is negative. Due to ongoing significant tenderness CT abdomen/pelvis was ordered which is pending at shift change. Plan is to reassess after and PO challenge.  CT shows left sided ovarian cyst rupture. Pt could be having referred pain from this. She reports ongoing pain and nausea. She was given a dose of oral medication here and tolerated. She is taking Ibuprofen already at home. She was advised to continue this and was prescribed Zofran and small amount of pain medicine. She was also advised to f/u with women's clinic.   Bethel Born, PA-C 05/03/20 2340    Margarita Grizzle, MD 05/04/20 1215

## 2020-05-03 NOTE — ED Notes (Signed)
Pt transported to CT via stretcher.  

## 2020-05-04 ENCOUNTER — Ambulatory Visit: Payer: Self-pay | Admitting: Obstetrics and Gynecology

## 2020-05-04 LAB — GC/CHLAMYDIA PROBE AMP (~~LOC~~) NOT AT ARMC
Chlamydia: NEGATIVE
Comment: NEGATIVE
Comment: NORMAL
Neisseria Gonorrhea: NEGATIVE

## 2020-05-28 ENCOUNTER — Other Ambulatory Visit: Payer: Self-pay

## 2020-05-28 ENCOUNTER — Ambulatory Visit (INDEPENDENT_AMBULATORY_CARE_PROVIDER_SITE_OTHER): Payer: Self-pay | Admitting: Obstetrics and Gynecology

## 2020-05-28 ENCOUNTER — Encounter: Payer: Self-pay | Admitting: Obstetrics and Gynecology

## 2020-05-28 VITALS — BP 125/79 | HR 64 | Wt 184.9 lb

## 2020-05-28 DIAGNOSIS — Z8742 Personal history of other diseases of the female genital tract: Secondary | ICD-10-CM

## 2020-05-28 DIAGNOSIS — Z30011 Encounter for initial prescription of contraceptive pills: Secondary | ICD-10-CM

## 2020-05-28 DIAGNOSIS — E282 Polycystic ovarian syndrome: Secondary | ICD-10-CM

## 2020-05-28 DIAGNOSIS — Z789 Other specified health status: Secondary | ICD-10-CM

## 2020-05-28 MED ORDER — NORGESTIMATE-ETH ESTRADIOL 0.25-35 MG-MCG PO TABS
1.0000 | ORAL_TABLET | Freq: Every day | ORAL | 3 refills | Status: DC
Start: 2020-05-28 — End: 2022-04-04

## 2020-05-28 NOTE — Progress Notes (Signed)
Obstetrics and Gynecology Visit Return Patient Evaluation  Appointment Date: 05/28/2020  Primary Care Provider: Unknown Jim  OBGYN Clinic: [facility]  Chief Complaint: f/u ED visit for abdominal pain, ? Ruptured cyst. contraception management  BMI 30s, h/o ICP in pregnancy  History of Present Illness:  Amber Warren is a 29 y.o. G2P2 with above CC. PMHx significant for BMI 30s, h/o ICP during pregnancy, c-section x 2, PCOS  Interval History: Patient went to ED on 7/11 and dx with presumed ovarian cyst that ruptured, no FF seen in pelvis and CBC negative; beta hcg, u/a and STD swab negative; see below. No pain today and no prior h/o ED visits for this. Periods are qmonth, regular and not particularly heavy or painful.  patient has h/o nexplanon but had it removed due to what sounds like AUB. She used pills in the past and is interested in them again. She has no contraindications to pill use.   Last pap 2019 at [facility] negative and negative hpv  Review of Systems:  as noted in the History of Present Illness.  Medications:  None  Allergies: has No Known Allergies.  Physical Exam:  BP 125/79   Pulse 64   Wt 184 lb 14.4 oz (83.9 kg)   LMP 05/11/2020 (Exact Date)   Breastfeeding Unknown   BMI 34.94 kg/m  Body mass index is 34.94 kg/m. General appearance: Well nourished, well developed female in no acute distress.  Abdomen: diffusely non tender to palpation, non distended, and no masses, hernias Neuro/Psych:  Normal mood and affect.     Radiology Narrative & Impression  CLINICAL DATA:  29 year old female with right lower quadrant abdominal pain.  EXAM: CT ABDOMEN AND PELVIS WITH CONTRAST  TECHNIQUE: Multidetector CT imaging of the abdomen and pelvis was performed using the standard protocol following bolus administration of intravenous contrast.  CONTRAST:  OMNIPAQUE IOHEXOL 300 MG/ML  SOLN  COMPARISON:   CT abdomen pelvis dated 03/24/2018. Pelvic ultrasound dated 05/03/2020.  FINDINGS: Lower chest: The visualized lung bases are clear.  No intra-abdominal free air. Trace free fluid in the pelvis.  Hepatobiliary: No focal liver abnormality is seen. No gallstones, gallbladder wall thickening, or biliary dilatation.  Pancreas: Unremarkable. No pancreatic ductal dilatation or surrounding inflammatory changes.  Spleen: Normal in size without focal abnormality.  Adrenals/Urinary Tract: Adrenal glands are unremarkable. Kidneys are normal, without renal calculi, focal lesion, or hydronephrosis. Bladder is unremarkable.  Stomach/Bowel: There is no bowel obstruction or active inflammation. The appendix is normal.  Vascular/Lymphatic: The abdominal aorta and IVC unremarkable. No portal venous gas. There is no adenopathy.  Reproductive: The uterus is anteverted and appears unremarkable. There is a 2 cm left ovarian corpus luteum with probable rupture. The right ovary is unremarkable.  Other: None  Musculoskeletal: No acute or significant osseous findings.  IMPRESSION: 1. A 2 cm left ovarian corpus luteum with probable rupture. 2. No bowel obstruction. Normal appendix.   Electronically Signed   By: Elgie Collard M.D.   On: 05/03/2020 22:20   Narrative & Impression  CLINICAL DATA:  29 year old female with lower abdominal pain, pelvic pain.  EXAM: TRANSABDOMINAL AND TRANSVAGINAL ULTRASOUND OF PELVIS  DOPPLER ULTRASOUND OF OVARIES  TECHNIQUE: Both transabdominal and transvaginal ultrasound examinations of the pelvis were performed. Transabdominal technique was performed for global imaging of the pelvis including uterus, ovaries, adnexal regions, and pelvic cul-de-sac.  It was necessary to proceed with endovaginal exam following the transabdominal exam to visualize the  ovaries. Color and duplex Doppler ultrasound was utilized to evaluate blood flow to  the ovaries.  COMPARISON:  noncontrast CT Abdomen and Pelvis 03/24/2018 Ob ultrasound 07/25/2017.  FINDINGS: Uterus  Measurements: 9.9 x 4.0 x 5.4 cm = volume: 109 mL. No fibroids or other mass visualized.  Endometrium  Thickness: 6 mm.  No focal abnormality visualized.  Right ovary  Measurements: 2.4 x 2.0 x 2.3 cm = volume: 6 mL. Normal appearance/no adnexal mass.  Left ovary  Measurements: 3.2 x 2.2 x 4.2 cm = volume: 15 mL. Better visualized transabdominally. Normal appearance/no adnexal mass.  Pulsed Doppler evaluation of both ovaries demonstrates normal low-resistance arterial and venous waveforms.  Other findings  No pelvic free fluid.  IMPRESSION: Negative.  No evidence of ovarian torsion.   Electronically Signed   By: Odessa Fleming M.D.   On: 05/03/2020 21:00    Labs: CBC Latest Ref Rng & Units 05/03/2020 11/21/2019 03/24/2018  WBC 4.0 - 10.5 K/uL 9.8 10.9(H) 7.9  Hemoglobin 12.0 - 15.0 g/dL 78.2 95.6 21.3  Hematocrit 36 - 46 % 40.5 44.6 38.2  Platelets 150 - 400 K/uL 277 337 332   Assessment: pt stable  Plan:  1. History of ovarian cyst Given history and s/s I told her I recommend combined OCPs, which she is amenable to. Precautions given, particularly if she has itching with them. Recommend Sunday start. Sprintec 1 year supply sent in. Pt is self pay  2. PCOS (polycystic ovarian syndrome)  3. Language barrier Interpreter used  4. Encounter for initial prescription of contraceptive pills   RTC: PRN  Cornelia Copa MD Attending Center for [facility])

## 2020-05-28 NOTE — Progress Notes (Signed)
Spanish Interpreter Eda R. 

## 2020-08-25 ENCOUNTER — Other Ambulatory Visit: Payer: Self-pay

## 2020-08-25 ENCOUNTER — Emergency Department (HOSPITAL_COMMUNITY)
Admission: EM | Admit: 2020-08-25 | Discharge: 2020-08-25 | Disposition: A | Discharge status: Home / Self Care | Payer: No Typology Code available for payment source | Attending: Emergency Medicine | Admitting: Emergency Medicine

## 2020-08-25 ENCOUNTER — Encounter (HOSPITAL_COMMUNITY): Payer: Self-pay | Admitting: Emergency Medicine

## 2020-08-25 DIAGNOSIS — R110 Nausea: Secondary | ICD-10-CM | POA: Insufficient documentation

## 2020-08-25 DIAGNOSIS — R1030 Lower abdominal pain, unspecified: Secondary | ICD-10-CM | POA: Insufficient documentation

## 2020-08-25 DIAGNOSIS — Z5321 Procedure and treatment not carried out due to patient leaving prior to being seen by health care provider: Secondary | ICD-10-CM | POA: Insufficient documentation

## 2020-08-25 LAB — URINALYSIS, ROUTINE W REFLEX MICROSCOPIC
Bilirubin Urine: NEGATIVE
Glucose, UA: NEGATIVE mg/dL
Hgb urine dipstick: NEGATIVE
Ketones, ur: NEGATIVE mg/dL
Leukocytes,Ua: NEGATIVE
Nitrite: NEGATIVE
Protein, ur: NEGATIVE mg/dL
Specific Gravity, Urine: 1.023 (ref 1.005–1.030)
pH: 5 (ref 5.0–8.0)

## 2020-08-25 LAB — COMPREHENSIVE METABOLIC PANEL WITH GFR
ALT: 20 U/L (ref 0–44)
AST: 17 U/L (ref 15–41)
Albumin: 3.4 g/dL — ABNORMAL LOW (ref 3.5–5.0)
Alkaline Phosphatase: 62 U/L (ref 38–126)
Anion gap: 11 (ref 5–15)
BUN: 13 mg/dL (ref 6–20)
CO2: 23 mmol/L (ref 22–32)
Calcium: 9.1 mg/dL (ref 8.9–10.3)
Chloride: 104 mmol/L (ref 98–111)
Creatinine, Ser: 0.56 mg/dL (ref 0.44–1.00)
GFR, Estimated: >60 mL/min
Glucose, Bld: 106 mg/dL — ABNORMAL HIGH (ref 70–99)
Potassium: 3.9 mmol/L (ref 3.5–5.1)
Sodium: 138 mmol/L (ref 135–145)
Total Bilirubin: 0.3 mg/dL (ref 0.3–1.2)
Total Protein: 6.5 g/dL (ref 6.5–8.1)

## 2020-08-25 LAB — I-STAT BETA HCG BLOOD, ED (MC, WL, AP ONLY): I-stat hCG, quantitative: <5 m[IU]/mL

## 2020-08-25 LAB — CBC
HCT: 41.1 % (ref 36.0–46.0)
Hemoglobin: 13.4 g/dL (ref 12.0–15.0)
MCH: 29.3 pg (ref 26.0–34.0)
MCHC: 32.6 g/dL (ref 30.0–36.0)
MCV: 89.9 fL (ref 80.0–100.0)
Platelets: 263 K/uL (ref 150–400)
RBC: 4.57 MIL/uL (ref 3.87–5.11)
RDW: 13 % (ref 11.5–15.5)
WBC: 11.2 K/uL — ABNORMAL HIGH (ref 4.0–10.5)
nRBC: 0 % (ref 0.0–0.2)

## 2020-08-25 LAB — LIPASE, BLOOD: Lipase: 26 U/L (ref 11–51)

## 2020-08-25 NOTE — ED Triage Notes (Signed)
Pt c/o low abd pain x 1 week, + nausea, denies diarrhea.  Denies urinary/gyn s/s.

## 2020-08-25 NOTE — ED Notes (Signed)
Called pt name x4 to be roomed. No response from pt.  

## 2021-09-26 ENCOUNTER — Emergency Department (HOSPITAL_COMMUNITY)
Admission: EM | Admit: 2021-09-26 | Discharge: 2021-09-27 | Disposition: A | Discharge status: Home / Self Care | Payer: Self-pay | Attending: Emergency Medicine | Admitting: Emergency Medicine

## 2021-09-26 ENCOUNTER — Other Ambulatory Visit: Payer: Self-pay

## 2021-09-26 ENCOUNTER — Encounter (HOSPITAL_COMMUNITY): Payer: Self-pay | Admitting: Emergency Medicine

## 2021-09-26 ENCOUNTER — Emergency Department (HOSPITAL_COMMUNITY): Payer: Self-pay

## 2021-09-26 DIAGNOSIS — J101 Influenza due to other identified influenza virus with other respiratory manifestations: Secondary | ICD-10-CM | POA: Insufficient documentation

## 2021-09-26 DIAGNOSIS — Z20822 Contact with and (suspected) exposure to covid-19: Secondary | ICD-10-CM | POA: Insufficient documentation

## 2021-09-26 DIAGNOSIS — Z5321 Procedure and treatment not carried out due to patient leaving prior to being seen by health care provider: Secondary | ICD-10-CM | POA: Insufficient documentation

## 2021-09-26 LAB — RESP PANEL BY RT-PCR (FLU A&B, COVID) ARPGX2
Influenza A by PCR: POSITIVE — AB
Influenza B by PCR: NEGATIVE
SARS Coronavirus 2 by RT PCR: NEGATIVE

## 2021-09-26 NOTE — ED Provider Notes (Signed)
Emergency Medicine Provider Triage Evaluation Note  Amber Warren , a 30 y.o. female  was evaluated in triage.  Pt complains of nonproductive cough since yesterday.  She denies any associated back pain, headache, chills, fever, shortness of breath, posttussive emesis.  She has not tried medications for her symptoms.  She denies chest pain.  Review of Systems  Positive: Nonproductive cough, headache Negative: Chest pain  Physical Exam  BP (!) 136/99 (BP Location: Left Arm)   Pulse 95   Temp 98.2 F (36.8 C) (Oral)   Resp 17   LMP 09/08/2021   SpO2 99%  Gen:   Awake, no distress   Resp:  Normal effort  MSK:   Moves extremities without difficulty   Medical Decision Making  Medically screening exam initiated at 2:44 PM.  Appropriate orders placed.  Nadeen Landau was informed that the remainder of the evaluation will be completed by another provider, this initial triage assessment does not replace that evaluation, and the importance of remaining in the ED until their evaluation is complete.    Gloyd Happ A, PA-C 09/26/21 1448    Gerhard Munch, MD 09/26/21 [phone]

## 2021-09-26 NOTE — ED Triage Notes (Signed)
Pt reports non-productive cough, back pain, headache, chills, fever, sob, and vomiting that is cough induced since yesterday.

## 2021-09-26 NOTE — ED Notes (Signed)
Pt called for vitals no answer. °

## 2022-04-04 ENCOUNTER — Telehealth: Payer: Self-pay | Admitting: *Deleted

## 2022-04-04 ENCOUNTER — Ambulatory Visit: Payer: Self-pay | Admitting: Obstetrics & Gynecology

## 2022-04-04 ENCOUNTER — Encounter: Payer: Self-pay | Admitting: Obstetrics & Gynecology

## 2022-04-04 VITALS — BP 122/62 | HR 60 | Resp 14 | Wt 186.0 lb

## 2022-04-04 DIAGNOSIS — E282 Polycystic ovarian syndrome: Secondary | ICD-10-CM

## 2022-04-04 DIAGNOSIS — N921 Excessive and frequent menstruation with irregular cycle: Secondary | ICD-10-CM

## 2022-04-04 MED ORDER — NORETHINDRONE 0.35 MG PO TABS: 1.0000 | ORAL_TABLET | Freq: Every day | ORAL | 4 refills | Status: DC

## 2022-04-04 NOTE — Telephone Encounter (Signed)
Please call patient. Patient offered first available ultrasound with Dr. Seymour Bars in August at check out. Patient asked to have done sooner. Per Dr. Seymour Bars, okay to have out of office ultrasound, but she would need to come back into the office to discuss the results. Please coordinate.

## 2022-04-04 NOTE — Telephone Encounter (Signed)
Sent message to [place] to relay to patient. Order placed at [facility] [employer].

## 2022-04-04 NOTE — Progress Notes (Signed)
    Amber Warren November 05, 1990 [id number]        31 y.o.  G2P2L2 Stable relationship with FOBs.  RP: Menometrorrhagia x 4-5 months  HPI: H/O PCOS.  Tried Nexplanon and Skyla IUD in the last 3-4 years without successful control of the irregular bleeding.  Currently using condoms as needed.  No pelvic pain except for cramps with bleeding.  No fever.  Urine/BMs normal.   OB History  Gravida Para Term Preterm AB Living  2 2 2  0 0 2  SAB IAB Ectopic Multiple Live Births  0 0 0 0 2    # Outcome Date GA Lbr Len/2nd Weight Sex Delivery Anes PTL Lv  2 Term 02/16/18 [redacted]w[redacted]d  7 lb 14.5 oz (3.585 kg) M CS-LTranv EPI  LIV  1 Term 10/02/14 [redacted]w[redacted]d  8 lb 7.8 oz (3.85 kg) M CS-LTranv EPI  LIV    Past medical history,surgical history, problem list, medications, allergies, family history and social history were all reviewed and documented in the EPIC chart.   Directed ROS with pertinent positives and negatives documented in the history of present illness/assessment and plan.  Exam:  Vitals:   04/04/22 0956  BP: 122/62  Pulse: 60  Resp: 14  Weight: 186 lb (84.4 kg)   General appearance:  Normal  Abdomen: Normal  Gynecologic exam: Vulva normal.  Bimanual exam:  Uterus AV, normal volume, mobile, NT.  No adnexal mass felt, NT.  Mild dark vaginal blood on exam.   Assessment/Plan:  31 y.o. G2P2002   1. Menometrorrhagia H/O PCOS.  Tried Nexplanon and Skyla IUD in the last 3-4 years without successful control of the irregular bleeding.  Currently using condoms as needed.  No pelvic pain except for cramps with bleeding.  No fever.  Urine/BMs normal.  Normal gynecologic exam.  Will investigate with blood work today and Pelvic US at f/u.  Start on the Progestin only BCPs for control of heavy bleeding and contraception. - CBC - TSH - Prolactin - HgB A1c - DHEA-sulfate - Testos,Total,Free and SHBG (Female) - US Transvaginal Non-OB; Future - hCG, serum, qualitative  2. PCO (polycystic  ovaries) Probably having chronic micro-ovulation or anovulation.  Will investigate with lab work today.  Not attempting conception.  Will control the bleeding and offer contraception with the Progestin only BCP.  No CI.  Usage reviewed and prescription sent to pharmacy. - TSH - Prolactin - HgB A1c - DHEA-sulfate - Testos,Total,Free and SHBG (Female)  Other orders - AMOXICILLIN PO; Take 875 mg by mouth. - norethindrone (MICRONOR) 0.35 MG tablet; Take 1 tablet (0.35 mg total) by mouth daily  Counseling and management of prolonged menometrorrhagia, all possible causes including endometrial polyps, uterine fibroids, endometrial hyperplasia and Ca, endocrine dysfunctions including PCOS and the risk of secondary anemia, hormonal and medical management for 45 minutes.  Princess Bruins MD, 10:37 AM 04/04/2022

## 2022-04-05 LAB — HCG, SERUM, QUALITATIVE: Preg, Serum: NEGATIVE

## 2022-04-06 NOTE — Telephone Encounter (Signed)
Ultrasound scheduled on 04/11/22 at [facility].

## 2022-04-08 LAB — HEMOGLOBIN A1C
Hgb A1c MFr Bld: 5.3 % of total Hgb (ref <5.7)
Mean Plasma Glucose: 105 mg/dL
eAG (mmol/L): 5.8 mmol/L

## 2022-04-08 LAB — CBC
HCT: 42.7 % (ref 35.0–45.0)
Hemoglobin: 14.4 g/dL (ref 11.7–15.5)
MCH: 31 pg (ref 27.0–33.0)
MCHC: 33.7 g/dL (ref 32.0–36.0)
MCV: 92 fL (ref 80.0–100.0)
MPV: 11.2 fL (ref 7.5–12.5)
Platelets: 364 10*3/uL (ref 140–400)
RBC: 4.64 10*6/uL (ref 3.80–5.10)
RDW: 13.1 % (ref 11.0–15.0)
WBC: 8.2 10*3/uL (ref 3.8–10.8)

## 2022-04-08 LAB — TESTOS,TOTAL,FREE AND SHBG (FEMALE)
Free Testosterone: 1.8 pg/mL (ref 0.1–6.4)
Sex Hormone Binding: 55 nmol/L (ref 17–124)
Testosterone, Total, LC-MS-MS: 23 ng/dL (ref 2–45)

## 2022-04-08 LAB — TSH: TSH: 1.36 mIU/L

## 2022-04-08 LAB — PROLACTIN: Prolactin: 10.1 ng/mL

## 2022-04-08 LAB — DHEA-SULFATE: DHEA-SO4: 134 ug/dL (ref 19–237)

## 2022-04-11 ENCOUNTER — Ambulatory Visit
Admission: RE | Admit: 2022-04-11 | Discharge: 2022-04-11 | Disposition: A | Discharge status: Home / Self Care | Payer: Self-pay | Source: Ambulatory Visit | Attending: Obstetrics & Gynecology | Admitting: Obstetrics & Gynecology

## 2022-04-11 DIAGNOSIS — N921 Excessive and frequent menstruation with irregular cycle: Secondary | ICD-10-CM

## 2022-04-22 ENCOUNTER — Encounter: Payer: Self-pay | Admitting: Obstetrics & Gynecology

## 2022-04-22 ENCOUNTER — Ambulatory Visit (INDEPENDENT_AMBULATORY_CARE_PROVIDER_SITE_OTHER): Payer: Self-pay | Admitting: Obstetrics & Gynecology

## 2022-04-22 VITALS — BP 108/70 | Ht <= 58 in | Wt 187.0 lb

## 2022-04-22 DIAGNOSIS — E282 Polycystic ovarian syndrome: Secondary | ICD-10-CM

## 2022-04-22 DIAGNOSIS — N921 Excessive and frequent menstruation with irregular cycle: Secondary | ICD-10-CM

## 2022-04-22 NOTE — Progress Notes (Addendum)
    Kamaryn Grimley 1991/01/26 [id number]        31 y.o.  G2P2L2   RP: Counseling on PCOS/Pelvic US results  HPI: Very light spotting x started on the Progestin only pill on 04/04/22.  No pelvic pain.  H/O Irregular/heavy menses associated with probable PCOS.  Blood work-up last visit completely normal.   OB History  Gravida Para Term Preterm AB Living  2 2 2  0 0 2  SAB IAB Ectopic Multiple Live Births  0 0 0 0 2    # Outcome Date GA Lbr Len/2nd Weight Sex Delivery Anes PTL Lv  2 Term 02/16/18 [redacted]w[redacted]d  7 lb 14.5 oz (3.585 kg) M CS-LTranv EPI  LIV  1 Term 10/02/14 [redacted]w[redacted]d  8 lb 7.8 oz (3.85 kg) M CS-LTranv EPI  LIV    Past medical history,surgical history, problem list, medications, allergies, family history and social history were all reviewed and documented in the EPIC chart.   Directed ROS with pertinent positives and negatives documented in the history of present illness/assessment and plan.  Exam:  Vitals:   04/22/22 1328  BP: 108/70  Weight: 187 lb (84.8 kg)  Height: 4' 8.5" (1.435 m)   General appearance:  Normal  Pelvic 04/24/22 at [facility] on 04/11/22:  Uterus: Measurements: 9.9 x 3.6 x 5.0 cm = volume: 93 mL. Anteverted. Normal morphology without mass. Nabothian cysts at cervix. Endometrium: Thickness: 4 mm.  No endometrial fluid or mass Right ovary: Not visualized, likely obscured by bowel Left ovary: Measurements: 2.2 x 2.0 x 1.4 cm = volume: 3.2 mL. Normal morphology without mass  No free pelvic fluid or adnexal masses.  Hb normal TSH normal HBA1C normal at 5.3 Testo normal   Assessment/Plan:  31 y.o. G2P2002   1. Menometrorrhagia Very light spotting x started on the Progestin only pill on 04/04/22.  No pelvic pain.  H/O Irregular/heavy menses associated with probable PCOS. Improved on the Progestin only pill.  No CI to continue.  Pelvic 06/04/22 reassuring with a normal uterus and thin endometrial line at 4 mm.  No pelvic mass.  2. PCO (polycystic ovaries)   Management discussed.  DepoProvera injections reviewed.  Decision to control on the Progestin pill at this time.  Counseling on menometrorrhagia and PCOS, documentation reviewed, for 15 minutes.  [place] MD, 1:42 PM 04/22/2022

## 2022-04-25 ENCOUNTER — Telehealth: Payer: Self-pay

## 2022-04-25 NOTE — Telephone Encounter (Signed)
Melanie at [facility] called. They are filling the generic Micronor for patient and had a drug interaction warning with Tranexamic Acid.  Upon review of chart Tranexamic Acid was d/c'd from patient med list on 04/05/22 because patient reported she was no longer taking it.  I informed the pharmacy of this.

## 2022-05-31 ENCOUNTER — Telehealth: Payer: Self-pay | Admitting: *Deleted

## 2022-05-31 NOTE — Telephone Encounter (Signed)
Dr.Lavoie please see the below message. Marena Chancy spoke with patient (spanish speaking)

## 2022-05-31 NOTE — Telephone Encounter (Signed)
-----   Message from [place] sent at 05/31/2022  3:26 PM EDT ----- Inbound call from pt stating she feels pills are not helping and is wanting to know if they can be switched or would she needs another ov.  Please advise.

## 2022-06-03 NOTE — Telephone Encounter (Signed)
Patient called back.  States she is currently taking the mini pill which did help stop the bleeding the first month but when she started on the second pack bleeding returned and has not stopped.

## 2022-06-03 NOTE — Telephone Encounter (Signed)
Dr.Lavoie replied "I don't see what pill she was taking.  I have not seen the patient since 03/2022"  Patient is taking Micronor per note 03/2022. Amber Warren can you call her to see what problems she is having with the pills? I assume bleeding, but Amber Warren did not say.

## 2022-06-06 NOTE — Telephone Encounter (Signed)
Dr. Seymour Bars please see Amber Warren message from patient she has been taking Micronor since 03/2022  "Patient said she is having irregular bleeding/spotting since she was here last. Some days are light some are heavier today she is only spotting, she is taking the oc's regularly has not missed any pills. she is also c/o cramping/fatigue/headache. Please advise.

## 2022-06-06 NOTE — Telephone Encounter (Signed)
Amber Warren can you call patient and confirm her bleeding is not heavy on Micronor. It is not abnormal to have irregular bleeding at times with pills if she is in her 2 pack. The providers normally recommend patients try 3 months worth before changing. However I will relay to [facility] once you tell me how her bleeding is. Please advise

## 2022-06-15 ENCOUNTER — Other Ambulatory Visit: Payer: Self-pay

## 2022-06-15 DIAGNOSIS — N926 Irregular menstruation, unspecified: Secondary | ICD-10-CM

## 2022-06-15 NOTE — Telephone Encounter (Signed)
Genia Del, MD  Ted Mcalpine L, RMA 46 minutes ago (11:02 AM)   Please bring her in with me for a visit and CBC/TSH.     Blanca advised. She will call patient and schedule and note labs on appt. Lab orders placed.

## 2022-06-17 NOTE — Telephone Encounter (Signed)
Patient scheduled on 06/24/22

## 2022-06-24 ENCOUNTER — Encounter: Payer: Self-pay | Admitting: Obstetrics & Gynecology

## 2022-06-24 ENCOUNTER — Ambulatory Visit (INDEPENDENT_AMBULATORY_CARE_PROVIDER_SITE_OTHER): Payer: Self-pay | Admitting: Obstetrics & Gynecology

## 2022-06-24 VITALS — BP 110/74 | HR 68

## 2022-06-24 DIAGNOSIS — N926 Irregular menstruation, unspecified: Secondary | ICD-10-CM

## 2022-06-24 LAB — PREGNANCY, URINE: Preg Test, Ur: NEGATIVE

## 2022-06-24 MED ORDER — NORETHIN ACE-ETH ESTRAD-FE 1-20 MG-MCG(24) PO TABS: 1.0000 | ORAL_TABLET | Freq: Every day | ORAL | 4 refills | Status: AC

## 2022-06-24 NOTE — Progress Notes (Signed)
    Amber Warren June 18, 1991 [id number]        31 y.o.  [id number]   RP: Irregular vaginal bleeding on the Progestin only pill  HPI: Irregular vaginal bleeding on the Progestin only pill.  Had 3 episodes of bleeding in 05/2022.  Each episode lasts 3-4 days with moderate flow.  CBC done with Fam MD on 06/20/22, pending.   OB History  Gravida Para Term Preterm AB Living  2 2 2  0 0 2  SAB IAB Ectopic Multiple Live Births  0 0 0 0 2    # Outcome Date GA Lbr Len/2nd Weight Sex Delivery Anes PTL Lv  2 Term 02/16/18 [redacted]w[redacted]d  7 lb 14.5 oz (3.585 kg) M CS-LTranv EPI  LIV  1 Term 10/02/14 [redacted]w[redacted]d  8 lb 7.8 oz (3.85 kg) M CS-LTranv EPI  LIV    Past medical history,surgical history, problem list, medications, allergies, family history and social history were all reviewed and documented in the EPIC chart.   Directed ROS with pertinent positives and negatives documented in the history of present illness/assessment and plan.  Exam:  Vitals:   06/24/22 1026  BP: 110/74  Pulse: 68   General appearance:  Normal  Abdomen: Normal  Gynecologic exam: Vulva normal.  Bimanual exam:  Uterus AV, normal volume, mobile, NT.  No adnexal mass, NT.  No blood or discharge.  Pelvic 08/24/22 at [facility] on 04/11/22:  Uterus: Measurements: 9.9 x 3.6 x 5.0 cm = volume: 93 mL. Anteverted. Normal morphology without mass. Nabothian cysts at cervix. Endometrium: Thickness: 4 mm.  No endometrial fluid or mass Right ovary: Not visualized, likely obscured by bowel Left ovary: Measurements: 2.2 x 2.0 x 1.4 cm = volume: 3.2 mL. Normal morphology without mass  No free pelvic fluid or adnexal masses.    UPT Neg   Assessment/Plan:  31 y.o. G2P2002   1. Irregular menstrual cycle on the Progestin only pill Irregular vaginal bleeding on the Progestin only pill.  Had 3 episodes of bleeding in 05/2022.  Each episode lasts 3-4 days with moderate flow.  CBC done with Fam MD on 06/20/22, will obtain results. Normal Gyne exam  today.  Pelvic 06/22/22 Normal on 04/11/2022.  Hormonal treatments discussed with patient.  Declines DepoProvera and Progesterone IUD.  Will try the Estrogen/Progestin BCPs with Lomedia 24 1/20.  No CI.  Usage, including continuous BCPs reviewed and prescription sent to pharmacy.  Will call back if her cycle is not controled at 3 months to further investigate with a repeat Pelvic US/Endometrial Bx.   - Pregnancy, urine  Other orders - Multiple Vitamin (MULTIVITAMIN PO); Take by mouth. - Norethindrone Acetate-Ethinyl Estrad-FE (LOESTRIN 24 FE) 1-20 MG-MCG(24) tablet; Take 1 tablet by mouth daily. May take continuously for dysmenorrhea.   2/20 MD, 10:50 AM 06/24/2022

## 2022-08-04 ENCOUNTER — Telehealth: Payer: Self-pay

## 2022-08-04 NOTE — Telephone Encounter (Signed)
Staff message received from [place] at front desk that patient wanted to talk with nurse about medication she is on.  I spoke with patient through interpretor ID [id number].    Patient was complaining of BTB in 2nd pack of new pill. I reassured her that BTB common side affect when starting a new pill and as long as not heavier than her period to continue to take pill every day and not to miss any as missed pills can cause bleeding.  I advised her that Dr. Mariah Milling 06/24/2022 office note states "Will call back if her cycle is not controled at 3 months to further investigate with a repeat Pelvic US/Endometrial Bx.  "  Patient voiced understanding and said she will try the 3rd pack and if bleeding continues she will call to schedule.

## 2022-09-05 ENCOUNTER — Telehealth: Payer: Self-pay | Admitting: *Deleted

## 2022-09-05 NOTE — Telephone Encounter (Signed)
-----   Message from [place] sent at 09/05/2022  2:23 PM EST ----- Pt called stating she finished meds but is still bleeding and  is now having cramping for the past 2 months as well.  Pt states ml mentioned about having an u/s and endo bx.  Wants to know where we go from here.  Please advise.

## 2022-09-05 NOTE — Telephone Encounter (Signed)
See below message from [place]  Per note on 06/24/22 "Will try the Estrogen/Progestin BCPs with Lomedia 24 1/20.  No CI.  Usage, including continuous BCPs reviewed and prescription sent to pharmacy.  Will call back if her cycle is not controled at 3 months to further investigate with a repeat Pelvic US/Endometrial Bx."

## 2022-09-13 NOTE — Telephone Encounter (Signed)
Dr.Lavoie please see the original message from [place], and let me know how you want to proceed.

## 2022-09-14 NOTE — Telephone Encounter (Signed)
Left voicemail for patient to call back to schedule appointment.

## 2022-09-14 NOTE — Telephone Encounter (Signed)
Amber Warren -please schedule PUS and EMB.   Amber Del, MD  You; Aura Camps, [place] minutes ago (11:47 AM)    Please schedule Pelvic US/Endometrial Bx

## 2022-09-28 NOTE — Telephone Encounter (Signed)
Tried calling again and left another voice mail for patient to call back to schedule appt.

## 2022-10-05 NOTE — Telephone Encounter (Signed)
Letter to Dr. Seymour Bars to review and sign.  Will mail to address on file.   Encounter closed.

## 2023-05-31 ENCOUNTER — Emergency Department (HOSPITAL_COMMUNITY)
Admission: EM | Admit: 2023-05-31 | Discharge: 2023-06-01 | Discharge status: Against Medical Advice | Payer: Self-pay | Attending: Emergency Medicine | Admitting: Emergency Medicine

## 2023-05-31 ENCOUNTER — Other Ambulatory Visit: Payer: Self-pay

## 2023-05-31 DIAGNOSIS — R350 Frequency of micturition: Secondary | ICD-10-CM | POA: Insufficient documentation

## 2023-05-31 DIAGNOSIS — Z5321 Procedure and treatment not carried out due to patient leaving prior to being seen by health care provider: Secondary | ICD-10-CM | POA: Insufficient documentation

## 2023-05-31 DIAGNOSIS — R1031 Right lower quadrant pain: Secondary | ICD-10-CM | POA: Insufficient documentation

## 2023-05-31 LAB — CBC
HCT: 42.1 % (ref 36.0–46.0)
Hemoglobin: 13.7 g/dL (ref 12.0–15.0)
MCH: 29.1 pg (ref 26.0–34.0)
MCHC: 32.5 g/dL (ref 30.0–36.0)
MCV: 89.4 fL (ref 80.0–100.0)
Platelets: 308 10*3/uL (ref 150–400)
RBC: 4.71 MIL/uL (ref 3.87–5.11)
RDW: 13.6 % (ref 11.5–15.5)
WBC: 9.1 10*3/uL (ref 4.0–10.5)
nRBC: 0 % (ref 0.0–0.2)

## 2023-05-31 LAB — BASIC METABOLIC PANEL
Anion gap: 8 (ref 5–15)
BUN: 9 mg/dL (ref 6–20)
CO2: 24 mmol/L (ref 22–32)
Calcium: 8.8 mg/dL — ABNORMAL LOW (ref 8.9–10.3)
Chloride: 103 mmol/L (ref 98–111)
Creatinine, Ser: 0.69 mg/dL (ref 0.44–1.00)
GFR, Estimated: >60 mL/min (ref >60)
Glucose, Bld: 100 mg/dL — ABNORMAL HIGH (ref 70–99)
Potassium: 3.9 mmol/L (ref 3.5–5.1)
Sodium: 135 mmol/L (ref 135–145)

## 2023-05-31 NOTE — ED Triage Notes (Signed)
Patient coming to ED for evaluation of R sided lower abdominal pain.  Radiates to back.  Having urinary frequency.  No burning or blood noted in urine.  Hx of irregular periods.

## 2023-08-30 IMAGING — CR DG CHEST 2V
2 series · 2 of 2 positions shown · non-contrast
Comparison: None.

CLINICAL DATA: Nonproductive cough, back pain, fever and shortness
of breath

EXAM:
CHEST - 2 VIEW

[chest pa]
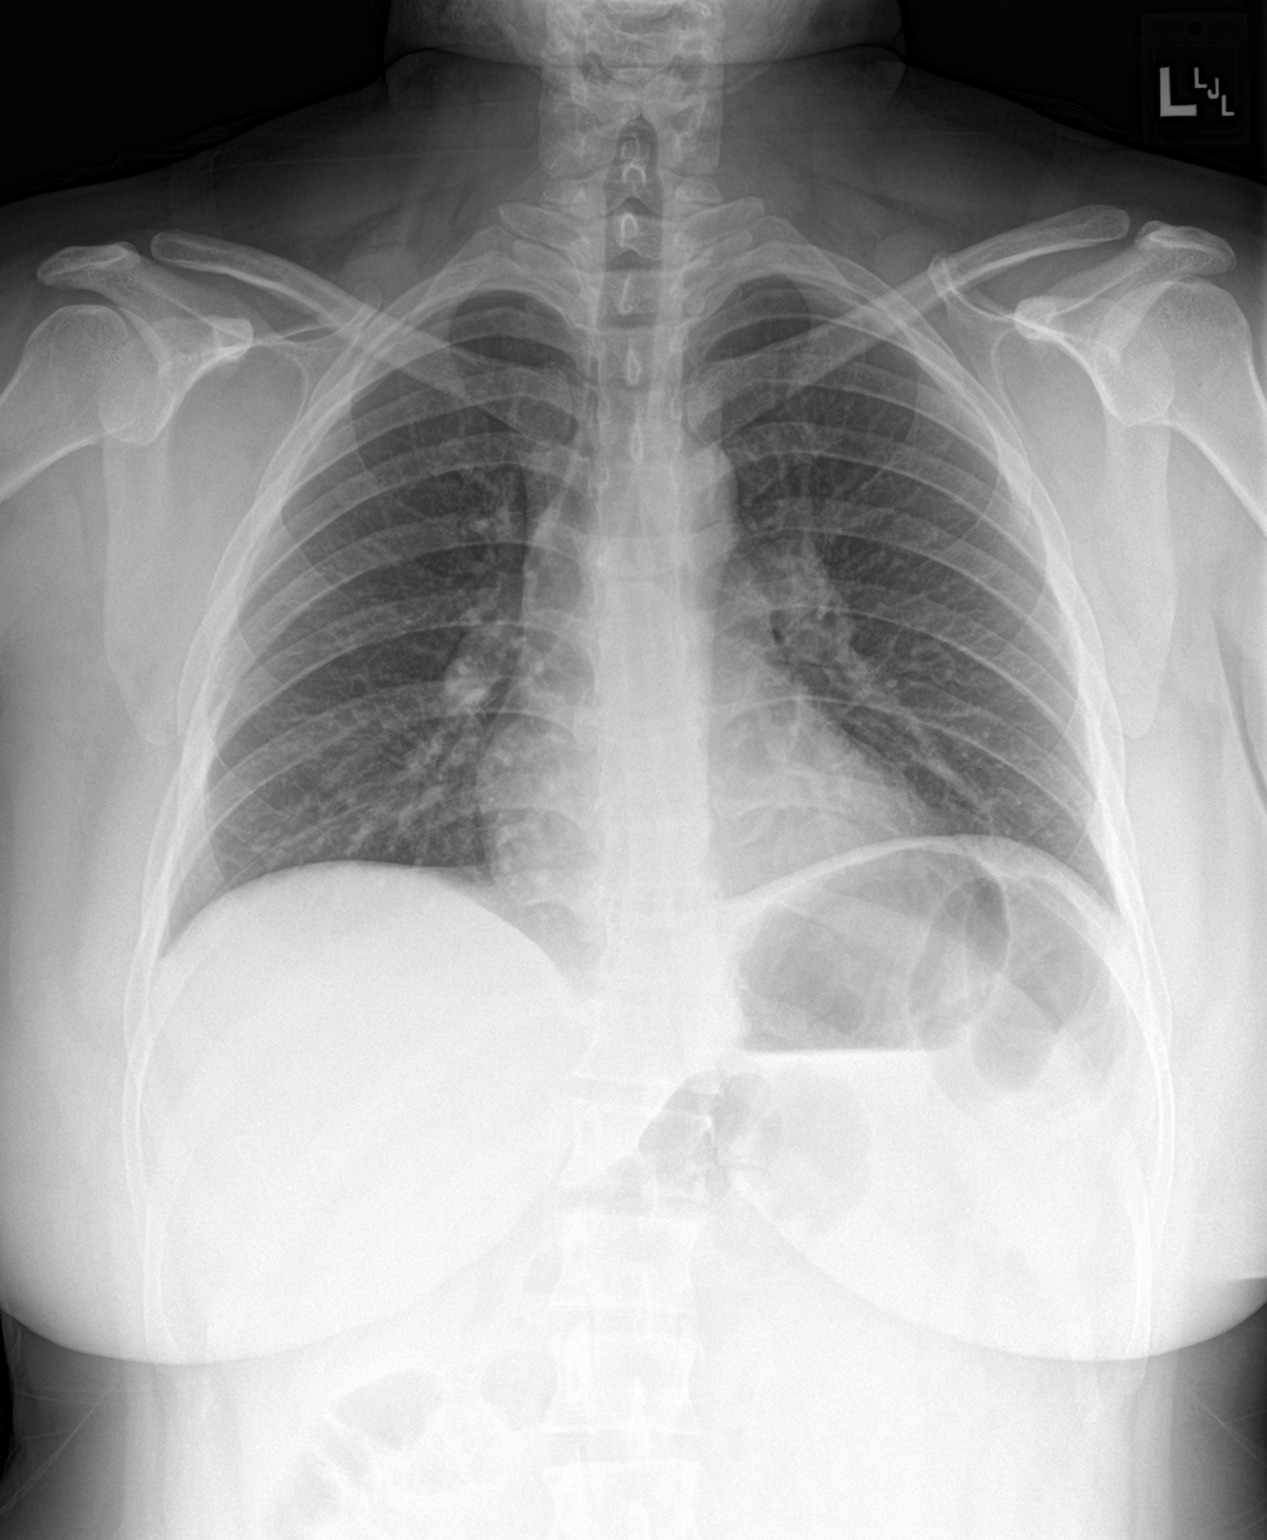

[chest lat]
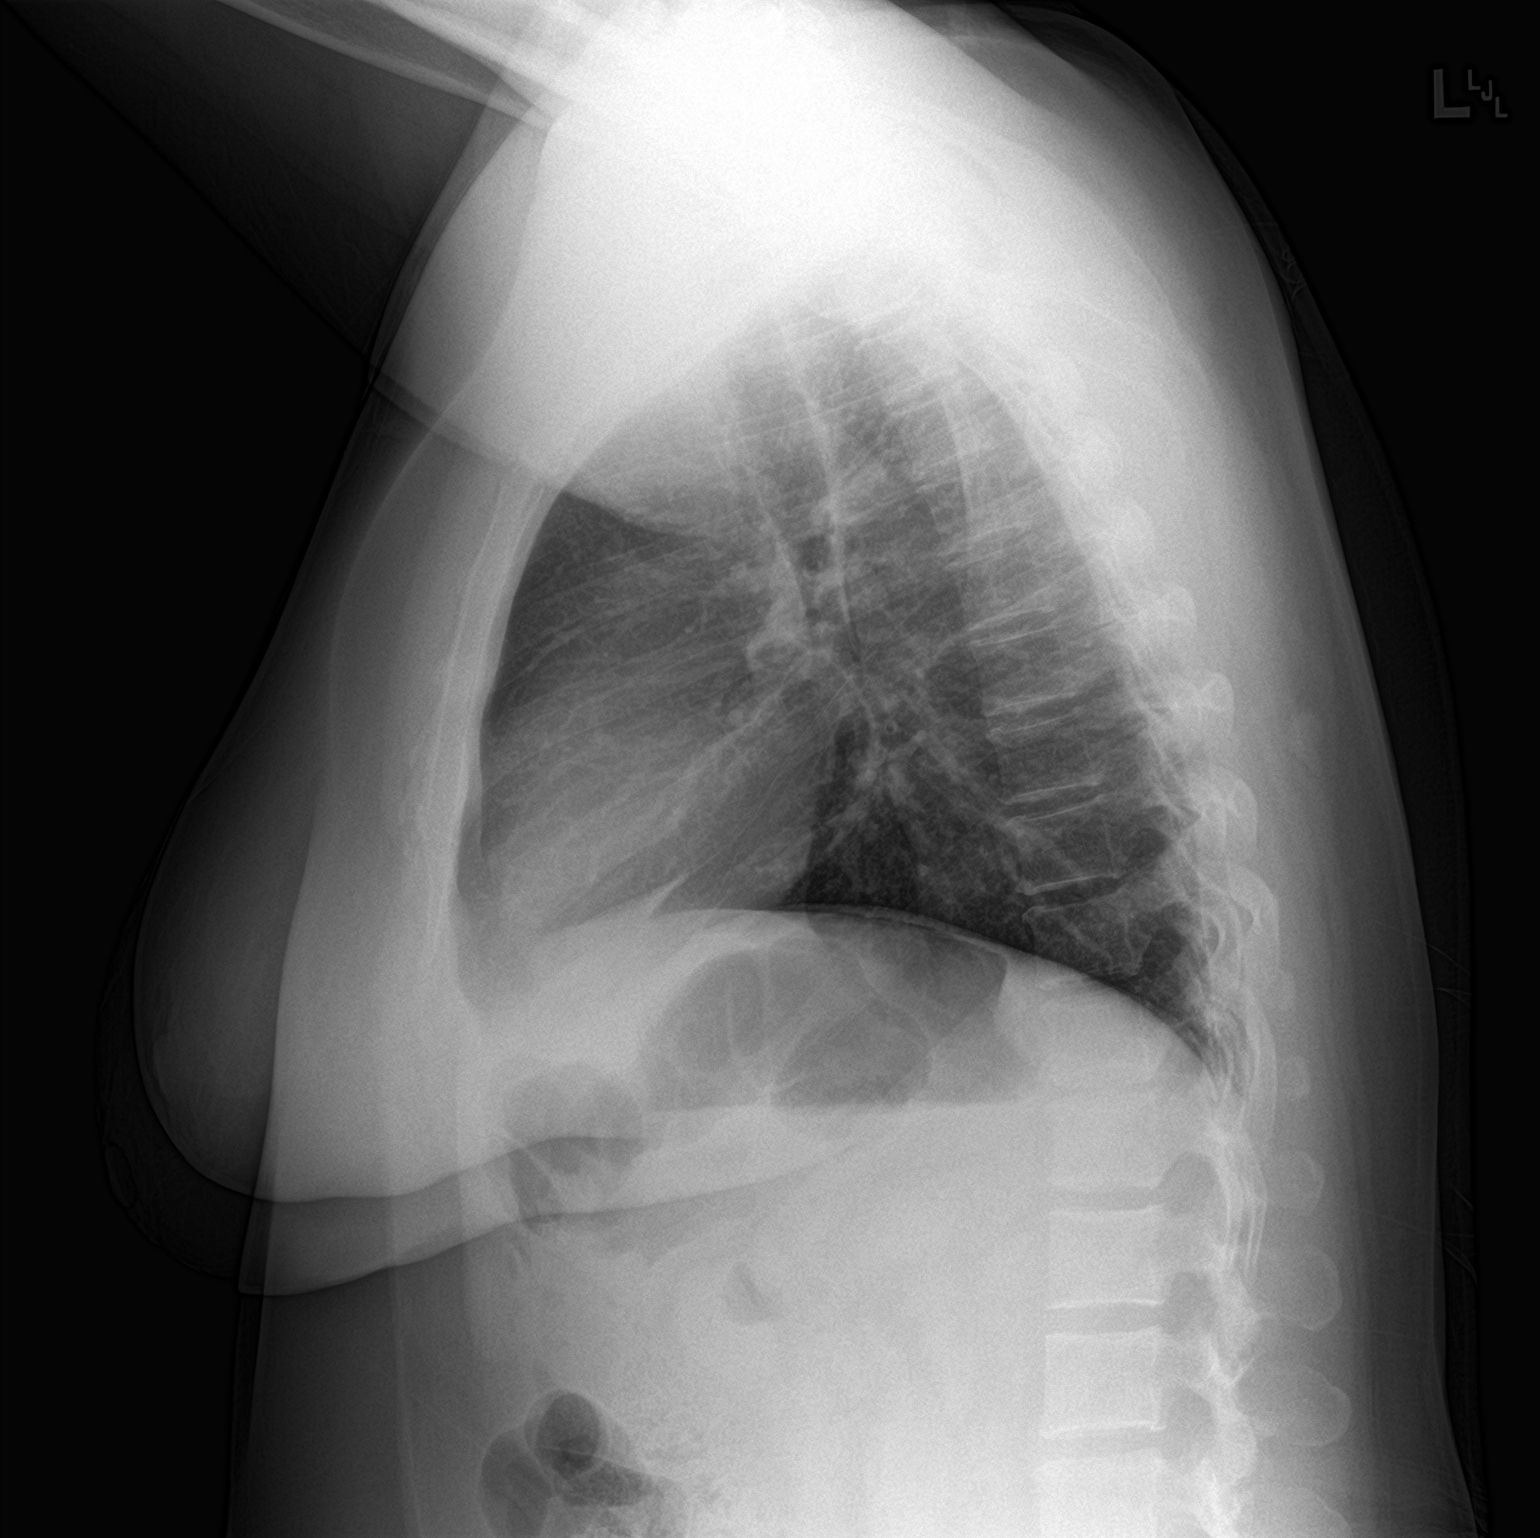

[2 of 2 positions shown; findings below may reference images not displayed]

FINDINGS: The heart size and mediastinal contours are within normal limits.
Both lungs are clear. The visualized skeletal structures are
unremarkable.
IMPRESSION: No active cardiopulmonary disease.

## 2024-05-21 ENCOUNTER — Encounter: Payer: Self-pay | Admitting: Nurse Practitioner

## 2024-05-23 ENCOUNTER — Ambulatory Visit: Payer: Self-pay | Admitting: *Deleted

## 2024-05-23 VITALS — Ht <= 58 in | Wt 160.7 lb

## 2024-05-23 DIAGNOSIS — N6325 Unspecified lump in the left breast, overlapping quadrants: Secondary | ICD-10-CM

## 2024-05-23 DIAGNOSIS — Z1239 Encounter for other screening for malignant neoplasm of breast: Secondary | ICD-10-CM

## 2024-05-23 DIAGNOSIS — N644 Mastodynia: Secondary | ICD-10-CM

## 2024-05-23 NOTE — Patient Instructions (Addendum)
 Explained breast self awareness with Darren Leonides Lipps. Patient did not need a Pap smear today due to last Pap smear was 2 years ago per patient. Let her know BCCCP will cover Pap smears every 3 years unless has a history of abnormal Pap smears. Referred patient to the [facility] for a diagnostic mammogram. Appointment scheduled Monday, May 27, 2024 at at 1430. Patient aware of appointment and will be there. Amber Warren verbalized understanding.  Shima Compere, Wanda Ship, RN 2:34 PM

## 2024-05-23 NOTE — Progress Notes (Addendum)
 Amber Warren is a 33 y.o. female who presents to [facility] clinic today with complaint of left breast lump and pain x 3 weeks. Patient stated the pain is constant. Patient rates the pain at a 5 out of 10. Patient started antibiotics on 05/22/2024 per patient with some improvement.    Pap Smear: Pap smear not completed today. Last Pap smear was 2 years ago at [facility] and was normal per patient. Per patient has no history of an abnormal Pap smear. Last Pap smear result is not available in Epic.   Physical exam: Breasts Breasts symmetrical. No skin abnormalities bilateral breasts. No nipple retraction bilateral breasts. No nipple discharge bilateral breasts. No lymphadenopathy. No lumps palpated right breast. Palpated a 1/2 pea sized lump within the left breast at 3 o'clock next to the nipple. Complaints of tenderness when palpated left breast lump.  MS DIGITAL DIAG TOMO BILAT Result Date: 04/14/2020 CLINICAL DATA:  33 year old presenting with burning pain and around the LEFT nipple over the past 4 months. This is the patient's initial baseline mammogram. Patient states no family history of breast cancer. EXAM: DIGITAL DIAGNOSTIC BILATERAL MAMMOGRAM WITH CAD AND TOMO ULTRASOUND LEFT BREAST COMPARISON:  None. ACR Breast Density Category c: The breast tissue is heterogeneously dense, which may obscure small masses. FINDINGS: Tomosynthesis and synthesized full field CC and MLO views of both breasts were obtained. No findings suspicious for malignancy in either breast. Specifically, no mammographic abnormalities in the subareolar LEFT breast in the area of burning pain. Mammographic images were processed with CAD. Targeted LEFT breast ultrasound is performed in the area of pain, showing normal dense fibroglandular tissue in the subareolar location. Upper normal caliber ducts are present, but there are no intraductal masses. No suspicious solid mass or abnormal acoustic shadowing is  identified. IMPRESSION: 1. No mammographic or sonographic evidence of malignancy involving the LEFT breast. 2. No mammographic evidence of malignancy involving the RIGHT breast. RECOMMENDATION: Screening mammogram at age 77 unless there are persistent or interval clinical concerns. (Code:SM-B-40A) Strategies for alleviating breast pain including decreasing caffeine intake, vitamin-E supplementation, and avoidance of tight compression brassieres, including underwire brassieres, were discussed with the patient. I have discussed the findings and recommendations with the patient. Communication with the patient was achieved with the assistance of a certified interpreter. If applicable, a reminder letter will be sent to the patient regarding the next appointment. BI-RADS CATEGORY  1: Negative. Electronically Signed   By: Debby Satterfield M.D.   On: 04/14/2020 15:26    Pelvic/Bimanual Pap is not indicated today per BCCCP guidelines.   Smoking History: Patient has never smoked.   Patient Navigation: Patient education provided. Access to services provided for patient through [place] program. Spanish interpreter Hadassah Satterfield from [facility] provided.   Breast and Cervical Cancer Risk Assessment: Patient does not have family history of breast cancer, known genetic mutations, or radiation treatment to the chest before age 55. Patient does not have history of cervical dysplasia, immunocompromised, or DES exposure in-utero.  Risk Scores as of Encounter on 05/23/2024     [place]           5-year 0.36%   Lifetime 12.38%   This patient is [place] but has no documented birth country, so the [place] model used data from [place] patients to calculate their risk score. Document a birth country in the Demographics activity for a more accurate score.         Last calculated by Logan Lyle BRAVO, CMA  on 05/23/2024 at  1:55 PM        A: BCCCP exam without pap smear Complaint of left breast lump and  pain.  P: Referred patient to the [facility] for a diagnostic mammogram. Appointment scheduled Monday, May 27, 2024 at at 1430.  Driscilla Wanda SQUIBB, RN 05/23/2024 2:34 PM

## 2024-05-27 ENCOUNTER — Other Ambulatory Visit: Payer: Self-pay | Admitting: Radiology

## 2024-05-28 LAB — SURGICAL PATHOLOGY
